# Patient Record
Sex: Male | Born: 1960 | Race: White | Hispanic: No | Marital: Married | Smoking: Former smoker
Health system: Southern US, Community
[De-identification: ages and names within clinical notes are randomized; demographics above are authoritative.]

## PROBLEM LIST (undated history)

## (undated) DIAGNOSIS — E7800 Pure hypercholesterolemia, unspecified: Secondary | ICD-10-CM

## (undated) DIAGNOSIS — C801 Malignant (primary) neoplasm, unspecified: Secondary | ICD-10-CM

## (undated) HISTORY — PX: TOTAL HIP ARTHROPLASTY: SHX124

## (undated) HISTORY — PX: MOHS SURGERY: SHX181

## (undated) HISTORY — PX: COLONOSCOPY: SHX174

---

## 2021-07-13 ENCOUNTER — Ambulatory Visit (HOSPITAL_BASED_OUTPATIENT_CLINIC_OR_DEPARTMENT_OTHER): Payer: BC Managed Care – PPO | Admitting: Orthopaedic Surgery

## 2021-07-13 ENCOUNTER — Ambulatory Visit (INDEPENDENT_AMBULATORY_CARE_PROVIDER_SITE_OTHER): Payer: BC Managed Care – PPO

## 2021-07-13 ENCOUNTER — Other Ambulatory Visit (HOSPITAL_BASED_OUTPATIENT_CLINIC_OR_DEPARTMENT_OTHER): Payer: Self-pay

## 2021-07-13 DIAGNOSIS — S86012A Strain of left Achilles tendon, initial encounter: Secondary | ICD-10-CM

## 2021-07-13 DIAGNOSIS — M25572 Pain in left ankle and joints of left foot: Secondary | ICD-10-CM | POA: Diagnosis not present

## 2021-07-13 MED ORDER — IBUPROFEN 800 MG PO TABS
800.0000 mg | ORAL_TABLET | Freq: Three times a day (TID) | ORAL | 0 refills | Status: AC
Start: 2021-07-13 — End: 2021-07-23
  Filled 2021-07-13: qty 30, 10d supply, fill #0

## 2021-07-13 MED ORDER — ASPIRIN 325 MG PO TBEC
325.0000 mg | DELAYED_RELEASE_TABLET | Freq: Every day | ORAL | 0 refills | Status: DC
  Filled 2021-07-13: qty 30, 30d supply, fill #0

## 2021-07-13 MED ORDER — ACETAMINOPHEN 500 MG PO TABS
500.0000 mg | ORAL_TABLET | Freq: Three times a day (TID) | ORAL | 0 refills | Status: AC
  Filled 2021-07-13: qty 30, 10d supply, fill #0

## 2021-07-13 MED ORDER — OXYCODONE HCL 5 MG PO TABS
5.0000 mg | ORAL_TABLET | ORAL | 0 refills | Status: AC | PRN
  Filled 2021-07-13: qty 20, 4d supply, fill #0

## 2021-07-13 NOTE — Progress Notes (Addendum)
Chief Complaint: Left Achilles rupture     History of Present Illness:    Victor Powers is a 61 y.o. male presents today after he had ruptured his Achilles on the previous Saturday playing basketball.  He states that he enjoys doing this as this past time.  He immediately felt a pop in the heel and subsequently had bruising and difficulty pushing off with the foot.  He is a father of Victor Powers here at [facility].  He is overall extremely active.  He does enjoy skiing and golfing as well as basketball.  He has been taking ibuprofen for pain.  He was placed in a splint in the emergency room and given crutches and told to remain nonweightbearing.  He is here today for further evaluation and assessment.  He is specifically concerned about his long-term outcome as he remains extremely active.  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 He is looking forward to an upcoming international trip this year where he will be walking many miles daily.    Surgical History:   None  PMH/PSH/Family History/Social History/Meds/Allergies:   No past medical history on file.  Social History   Socioeconomic History   Marital status: Married    Spouse name: Not on file   Number of children: Not on file   Years of education: Not on file   Highest education level: Not on file  Occupational History   Not on file  Tobacco Use   Smoking status: Not on file   Smokeless tobacco: Not on file  Substance and Sexual Activity   Alcohol use: Not on file   Drug use: Not on file   Sexual activity: Not on file  Other Topics Concern   Not on file  Social History Narrative   Not on file   Social Determinants of Health   Financial Resource Strain: Not on file  Food Insecurity: Not on file  Transportation Needs: Not on file  Physical Activity: Not on file  Stress: Not on file  Social Connections: Not on file   No  family history on file. No Known Allergies Current Outpatient Medications  Medication Sig Dispense Refill   acetaminophen (TYLENOL) 500 MG tablet Take 1 tablet (500 mg total) by mouth every 8 (eight) hours for 10 days. 30 tablet 0   aspirin EC 325 MG tablet Take 1 tablet (325 mg total) by mouth daily. 30 tablet 0   ibuprofen (ADVIL) 800 MG tablet Take 1 tablet (800 mg total) by mouth every 8 (eight) hours for 10 days. Please take with food, please alternate with acetaminophen 30 tablet 0   oxyCODONE (OXY IR/ROXICODONE) 5 MG immediate release tablet Take 1 tablet (5 mg total) by mouth every 4 (four) hours as needed (severe pain). 20 tablet 0   No current facility-administered medications for this visit.   No results found.  Review of Systems:   A ROS was performed including pertinent positives and negatives as documented in the HPI.  Physical Exam :   Constitutional: NAD and appears stated age Neurological: Alert and oriented Psych: Appropriate affect and cooperative There were no vitals taken for this visit.   Comprehensive Musculoskeletal Exam:    There is a palpable defect of the mid substance of  the left Achilles.  Thompson test reveals no ability to plantarflex when the calf is squeezed in prone position.  There is significant resting equinus of nearly 15 degrees compared to the contralateral side.  There is bruising about the foot and ankle area.  Sensation is intact in all distributions of the left foot.  2+ dorsalis pedis pulse  Imaging:   Xray (left ankle 3 views): Soft tissue defect in the area of the Achilles consistent with an Achilles tendon rupture    I personally reviewed and interpreted the radiographs.   Assessment:   61 y.o. male very active with a mid substance Achilles rupture after playing basketball.  At today's visit I did have a long discussion with him regarding the treatment options.  We specifically discussed conservative treatment in a boot with  functional rehab versus operative intervention.  I described that in terms of operative intervention, the additional benefits would be lack of deficit of liftoff strength as well as a lower risk of rerupture in the future.  He has significant concern about both of these as he does remain quite active in terms of skiing as well as playing basketball and hiking.  Specifically he states that he does use the left leg for pushoff and jump off when he is shooting it with flareups and he is concerned about this.  I discussed the conservative management could lead to a good outcome although some component of pushoff strength would likely be present given his difference in resting equinus on the [place] test.  Given the fact that he does remain quite active he is specifically hoping to undergo Achilles repair.  I did discuss the specific complications associated with Achilles repair including lack of skin healing and infection.  He understands this would like to proceed.  Plan :    -We will plan for left Achilles tendon repair   After a lengthy discussion of treatment options, including risks, benefits, alternatives, complications of surgical and nonsurgical conservative options, the patient elected surgical repair.   The patient  is aware of the material risks  and complications including, but not limited to injury to adjacent structures, neurovascular injury, infection, numbness, bleeding, implant failure, thermal burns, stiffness, persistent pain, failure to heal, disease transmission from allograft, need for further surgery, dislocation, anesthetic risks, blood clots, risks of death,and others. The probabilities of surgical success and failure discussed with patient given their particular co-morbidities.The time and nature of expected rehabilitation and recovery was discussed.The patient's questions were all answered preoperatively.  No barriers to understanding were noted. I explained the natural history of  the disease process and Rx rationale.  I explained to the patient what I considered to be reasonable expectations given their personal situation.  The final treatment plan was arrived at through a shared patient decision making process model.      I personally saw and evaluated the patient, and participated in the management and treatment plan.  Vanetta Mulders, MD Attending Physician, Orthopedic Surgery  This document was dictated using Dragon voice recognition software. A reasonable attempt at proof reading has been made to minimize errors.

## 2021-07-13 NOTE — H&P (View-Only) (Signed)
Chief Complaint: Left Achilles rupture     History of Present Illness:    Victor Powers is a 61 y.o. male presents today after he had ruptured his Achilles on the previous Saturday playing basketball.  He states that he enjoys doing this as this past time.  He immediately felt a pop in the heel and subsequently had bruising and difficulty pushing off with the foot.  He is a father of Victor Powers here at [facility].  He is overall extremely active.  He does enjoy skiing and golfing as well as basketball.  He has been taking ibuprofen for pain.  He was placed in a splint in the emergency room and given crutches and told to remain nonweightbearing.  He is here today for further evaluation and assessment.  He is specifically concerned about his long-term outcome as he remains extremely active.  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 He is looking forward to an upcoming international trip this year where he will be walking many miles daily.    Surgical History:   None  PMH/PSH/Family History/Social History/Meds/Allergies:   No past medical history on file.  Social History   Socioeconomic History   Marital status: Married    Spouse name: Not on file   Number of children: Not on file   Years of education: Not on file   Highest education level: Not on file  Occupational History   Not on file  Tobacco Use   Smoking status: Not on file   Smokeless tobacco: Not on file  Substance and Sexual Activity   Alcohol use: Not on file   Drug use: Not on file   Sexual activity: Not on file  Other Topics Concern   Not on file  Social History Narrative   Not on file   Social Determinants of Health   Financial Resource Strain: Not on file  Food Insecurity: Not on file  Transportation Needs: Not on file  Physical Activity: Not on file  Stress: Not on file  Social Connections: Not on file   No  family history on file. No Known Allergies Current Outpatient Medications  Medication Sig Dispense Refill   acetaminophen (TYLENOL) 500 MG tablet Take 1 tablet (500 mg total) by mouth every 8 (eight) hours for 10 days. 30 tablet 0   aspirin EC 325 MG tablet Take 1 tablet (325 mg total) by mouth daily. 30 tablet 0   ibuprofen (ADVIL) 800 MG tablet Take 1 tablet (800 mg total) by mouth every 8 (eight) hours for 10 days. Please take with food, please alternate with acetaminophen 30 tablet 0   oxyCODONE (OXY IR/ROXICODONE) 5 MG immediate release tablet Take 1 tablet (5 mg total) by mouth every 4 (four) hours as needed (severe pain). 20 tablet 0   No current facility-administered medications for this visit.   No results found.  Review of Systems:   A ROS was performed including pertinent positives and negatives as documented in the HPI.  Physical Exam :   Constitutional: NAD and appears stated age Neurological: Alert and oriented Psych: Appropriate affect and cooperative There were no vitals taken for this visit.   Comprehensive Musculoskeletal Exam:    There is a palpable defect of the mid substance of  the left Achilles.  Thompson test reveals no ability to plantarflex when the calf is squeezed in prone position.  There is significant resting equinus of nearly 15 degrees compared to the contralateral side.  There is bruising about the foot and ankle area.  Sensation is intact in all distributions of the left foot.  2+ dorsalis pedis pulse  Imaging:   Xray (left ankle 3 views): Soft tissue defect in the area of the Achilles consistent with an Achilles tendon rupture    I personally reviewed and interpreted the radiographs.   Assessment:   61 y.o. male very active with a mid substance Achilles rupture after playing basketball.  At today's visit I did have a long discussion with him regarding the treatment options.  We specifically discussed conservative treatment in a boot with  functional rehab versus operative intervention.  I described that in terms of operative intervention, the additional benefits would be lack of deficit of liftoff strength as well as a lower risk of rerupture in the future.  He has significant concern about both of these as he does remain quite active in terms of skiing as well as playing basketball and hiking.  Specifically he states that he does use the left leg for pushoff and jump off when he is shooting it with flareups and he is concerned about this.  I discussed the conservative management could lead to a good outcome although some component of pushoff strength would likely be present given his difference in resting equinus on the [place] test.  Given the fact that he does remain quite active he is specifically hoping to undergo Achilles repair.  I did discuss the specific complications associated with Achilles repair including lack of skin healing and infection.  He understands this would like to proceed.  Plan :    -We will plan for left Achilles tendon repair   After a lengthy discussion of treatment options, including risks, benefits, alternatives, complications of surgical and nonsurgical conservative options, the patient elected surgical repair.   The patient  is aware of the material risks  and complications including, but not limited to injury to adjacent structures, neurovascular injury, infection, numbness, bleeding, implant failure, thermal burns, stiffness, persistent pain, failure to heal, disease transmission from allograft, need for further surgery, dislocation, anesthetic risks, blood clots, risks of death,and others. The probabilities of surgical success and failure discussed with patient given their particular co-morbidities.The time and nature of expected rehabilitation and recovery was discussed.The patient's questions were all answered preoperatively.  No barriers to understanding were noted. I explained the natural history of  the disease process and Rx rationale.  I explained to the patient what I considered to be reasonable expectations given their personal situation.  The final treatment plan was arrived at through a shared patient decision making process model.      I personally saw and evaluated the patient, and participated in the management and treatment plan.  Vanetta Mulders, MD Attending Physician, Orthopedic Surgery  This document was dictated using Dragon voice recognition software. A reasonable attempt at proof reading has been made to minimize errors.

## 2021-07-15 ENCOUNTER — Encounter (HOSPITAL_BASED_OUTPATIENT_CLINIC_OR_DEPARTMENT_OTHER): Payer: Self-pay | Admitting: Orthopaedic Surgery

## 2021-07-16 ENCOUNTER — Encounter (HOSPITAL_COMMUNITY): Payer: Self-pay | Admitting: Orthopaedic Surgery

## 2021-07-16 ENCOUNTER — Ambulatory Visit (HOSPITAL_BASED_OUTPATIENT_CLINIC_OR_DEPARTMENT_OTHER): Payer: Self-pay | Admitting: Orthopaedic Surgery

## 2021-07-16 ENCOUNTER — Other Ambulatory Visit: Payer: Self-pay

## 2021-07-16 DIAGNOSIS — S86012A Strain of left Achilles tendon, initial encounter: Secondary | ICD-10-CM

## 2021-07-16 NOTE — Progress Notes (Signed)
Spoke with pt for pre-op call. Pt denies cardiac history, Diabetes or HTN. Pt is treated for high cholesterol.   Shower instructions given to pt and he voiced understanding.

## 2021-07-17 ENCOUNTER — Ambulatory Visit (HOSPITAL_COMMUNITY): Payer: BC Managed Care – PPO | Admitting: Certified Registered"

## 2021-07-17 ENCOUNTER — Other Ambulatory Visit: Payer: Self-pay

## 2021-07-17 ENCOUNTER — Encounter (HOSPITAL_COMMUNITY): Payer: Self-pay | Admitting: Orthopaedic Surgery

## 2021-07-17 ENCOUNTER — Encounter (HOSPITAL_COMMUNITY)
Admission: RE | Disposition: A | Discharge status: Home / Self Care | Payer: Self-pay | Source: Home / Self Care | Attending: Orthopaedic Surgery

## 2021-07-17 ENCOUNTER — Ambulatory Visit (HOSPITAL_COMMUNITY)
Admission: RE | Admit: 2021-07-17 | Discharge: 2021-07-17 | Disposition: A | Discharge status: Home / Self Care | Payer: BC Managed Care – PPO | Attending: Orthopaedic Surgery | Admitting: Orthopaedic Surgery

## 2021-07-17 ENCOUNTER — Other Ambulatory Visit (HOSPITAL_BASED_OUTPATIENT_CLINIC_OR_DEPARTMENT_OTHER): Payer: Self-pay | Admitting: Orthopaedic Surgery

## 2021-07-17 DIAGNOSIS — Y9367 Activity, basketball: Secondary | ICD-10-CM | POA: Insufficient documentation

## 2021-07-17 DIAGNOSIS — Z87891 Personal history of nicotine dependence: Secondary | ICD-10-CM | POA: Diagnosis not present

## 2021-07-17 DIAGNOSIS — S86012A Strain of left Achilles tendon, initial encounter: Secondary | ICD-10-CM

## 2021-07-17 HISTORY — DX: Malignant (primary) neoplasm, unspecified: C80.1

## 2021-07-17 HISTORY — PX: ACHILLES TENDON SURGERY: SHX542

## 2021-07-17 HISTORY — DX: Pure hypercholesterolemia, unspecified: E78.00

## 2021-07-17 LAB — CBC
HCT: 44.7 % (ref 39.0–52.0)
Hemoglobin: 15.6 g/dL (ref 13.0–17.0)
MCH: 31.6 pg (ref 26.0–34.0)
MCHC: 34.9 g/dL (ref 30.0–36.0)
MCV: 90.5 fL (ref 80.0–100.0)
Platelets: 277 10*3/uL (ref 150–400)
RBC: 4.94 MIL/uL (ref 4.22–5.81)
RDW: 11.8 % (ref 11.5–15.5)
WBC: 5.9 10*3/uL (ref 4.0–10.5)
nRBC: 0 % (ref 0.0–0.2)

## 2021-07-17 LAB — BASIC METABOLIC PANEL
Anion gap: 9 (ref 5–15)
BUN: 17 mg/dL (ref 8–23)
CO2: 26 mmol/L (ref 22–32)
Calcium: 9.6 mg/dL (ref 8.9–10.3)
Chloride: 105 mmol/L (ref 98–111)
Creatinine, Ser: 1.06 mg/dL (ref 0.61–1.24)
GFR, Estimated: >60 mL/min (ref >60)
Glucose, Bld: 112 mg/dL — ABNORMAL HIGH (ref 70–99)
Potassium: 3.9 mmol/L (ref 3.5–5.1)
Sodium: 140 mmol/L (ref 135–145)

## 2021-07-17 SURGERY — REPAIR, TENDON, ACHILLES
Anesthesia: General | Site: Leg Lower | Laterality: Left

## 2021-07-17 MED ORDER — ONDANSETRON HCL 4 MG/2ML IJ SOLN
INTRAMUSCULAR | Status: DC | PRN
  Administered 2021-07-17: 4 mg via INTRAVENOUS

## 2021-07-17 MED ORDER — CHLORHEXIDINE GLUCONATE 0.12 % MT SOLN
15.0000 mL | Freq: Once | OROMUCOSAL | Status: AC
Start: 2021-07-17 — End: 2021-07-17
  Administered 2021-07-17: 15 mL via OROMUCOSAL
  Filled 2021-07-17: qty 15

## 2021-07-17 MED ORDER — TRANEXAMIC ACID-NACL 1000-0.7 MG/100ML-% IV SOLN
INTRAVENOUS | Status: AC
  Filled 2021-07-17: qty 100

## 2021-07-17 MED ORDER — LACTATED RINGERS IV SOLN: INTRAVENOUS | Status: DC

## 2021-07-17 MED ORDER — PROPOFOL 10 MG/ML IV BOLUS
INTRAVENOUS | Status: AC
  Filled 2021-07-17: qty 20

## 2021-07-17 MED ORDER — MIDAZOLAM HCL 2 MG/2ML IJ SOLN
INTRAMUSCULAR | Status: DC | PRN
  Administered 2021-07-17: .5 mg via INTRAVENOUS

## 2021-07-17 MED ORDER — MIDAZOLAM HCL 2 MG/2ML IJ SOLN
INTRAMUSCULAR | Status: AC
  Administered 2021-07-17: 2 mg
  Filled 2021-07-17: qty 2

## 2021-07-17 MED ORDER — TRANEXAMIC ACID-NACL 1000-0.7 MG/100ML-% IV SOLN
1000.0000 mg | INTRAVENOUS | Status: AC
  Administered 2021-07-17: 1000 mg via INTRAVENOUS

## 2021-07-17 MED ORDER — ROCURONIUM BROMIDE 10 MG/ML (PF) SYRINGE
PREFILLED_SYRINGE | INTRAVENOUS | Status: AC
  Filled 2021-07-17: qty 10

## 2021-07-17 MED ORDER — PROPOFOL 10 MG/ML IV BOLUS
INTRAVENOUS | Status: DC | PRN
  Administered 2021-07-17: 160 mg via INTRAVENOUS

## 2021-07-17 MED ORDER — ORAL CARE MOUTH RINSE
15.0000 mL | Freq: Once | OROMUCOSAL | Status: AC
Start: 2021-07-17 — End: 2021-07-17

## 2021-07-17 MED ORDER — ACETAMINOPHEN 500 MG PO TABS
ORAL_TABLET | ORAL | Status: AC
  Filled 2021-07-17: qty 2

## 2021-07-17 MED ORDER — FENTANYL CITRATE (PF) 250 MCG/5ML IJ SOLN
INTRAMUSCULAR | Status: DC | PRN
  Administered 2021-07-17: 50 ug via INTRAVENOUS
  Administered 2021-07-17: 100 ug via INTRAVENOUS

## 2021-07-17 MED ORDER — MIDAZOLAM HCL 2 MG/2ML IJ SOLN
INTRAMUSCULAR | Status: AC
  Filled 2021-07-17: qty 2

## 2021-07-17 MED ORDER — MIDAZOLAM HCL 2 MG/2ML IJ SOLN: 2.0000 mg | Freq: Once | INTRAMUSCULAR | Status: DC

## 2021-07-17 MED ORDER — FENTANYL CITRATE (PF) 100 MCG/2ML IJ SOLN
INTRAMUSCULAR | Status: AC
  Administered 2021-07-17: 50 ug
  Filled 2021-07-17: qty 2

## 2021-07-17 MED ORDER — LIDOCAINE 2% (20 MG/ML) 5 ML SYRINGE
INTRAMUSCULAR | Status: AC
  Filled 2021-07-17: qty 5

## 2021-07-17 MED ORDER — 0.9 % SODIUM CHLORIDE (POUR BTL) OPTIME
TOPICAL | Status: DC | PRN
  Administered 2021-07-17: 1000 mL

## 2021-07-17 MED ORDER — LIDOCAINE 2% (20 MG/ML) 5 ML SYRINGE
INTRAMUSCULAR | Status: DC | PRN
  Administered 2021-07-17: 60 mg via INTRAVENOUS

## 2021-07-17 MED ORDER — DEXAMETHASONE SODIUM PHOSPHATE 10 MG/ML IJ SOLN
INTRAMUSCULAR | Status: AC
  Filled 2021-07-17: qty 1

## 2021-07-17 MED ORDER — ROCURONIUM BROMIDE 10 MG/ML (PF) SYRINGE
PREFILLED_SYRINGE | INTRAVENOUS | Status: DC | PRN
  Administered 2021-07-17: 50 mg via INTRAVENOUS

## 2021-07-17 MED ORDER — FENTANYL CITRATE (PF) 100 MCG/2ML IJ SOLN: 50.0000 ug | Freq: Once | INTRAMUSCULAR | Status: DC

## 2021-07-17 MED ORDER — SUGAMMADEX SODIUM 200 MG/2ML IV SOLN
INTRAVENOUS | Status: DC | PRN
  Administered 2021-07-17: 200 mg via INTRAVENOUS

## 2021-07-17 MED ORDER — GABAPENTIN 300 MG PO CAPS
300.0000 mg | ORAL_CAPSULE | Freq: Once | ORAL | Status: AC
  Administered 2021-07-17: 300 mg via ORAL

## 2021-07-17 MED ORDER — DEXAMETHASONE SODIUM PHOSPHATE 10 MG/ML IJ SOLN
INTRAMUSCULAR | Status: DC | PRN
  Administered 2021-07-17: 4 mg via INTRAVENOUS

## 2021-07-17 MED ORDER — CEFAZOLIN SODIUM-DEXTROSE 2-4 GM/100ML-% IV SOLN
INTRAVENOUS | Status: AC
  Filled 2021-07-17: qty 100

## 2021-07-17 MED ORDER — CEFAZOLIN SODIUM-DEXTROSE 2-4 GM/100ML-% IV SOLN
2.0000 g | INTRAVENOUS | Status: AC
  Administered 2021-07-17: 2 g via INTRAVENOUS

## 2021-07-17 MED ORDER — ONDANSETRON HCL 4 MG/2ML IJ SOLN
INTRAMUSCULAR | Status: AC
  Filled 2021-07-17: qty 2

## 2021-07-17 MED ORDER — BUPIVACAINE-EPINEPHRINE (PF) 0.5% -1:200000 IJ SOLN
INTRAMUSCULAR | Status: DC | PRN
  Administered 2021-07-17: 30 mL via PERINEURAL

## 2021-07-17 MED ORDER — ACETAMINOPHEN 500 MG PO TABS: 1000.0000 mg | ORAL_TABLET | Freq: Once | ORAL | Status: DC

## 2021-07-17 MED ORDER — FENTANYL CITRATE (PF) 250 MCG/5ML IJ SOLN
INTRAMUSCULAR | Status: AC
  Filled 2021-07-17: qty 5

## 2021-07-17 MED ORDER — GABAPENTIN 300 MG PO CAPS
ORAL_CAPSULE | ORAL | Status: AC
  Filled 2021-07-17: qty 1

## 2021-07-17 MED ORDER — PHENYLEPHRINE HCL-NACL 20-0.9 MG/250ML-% IV SOLN
INTRAVENOUS | Status: DC | PRN
  Administered 2021-07-17: 50 ug/min via INTRAVENOUS
  Administered 2021-07-17: 35 ug/min via INTRAVENOUS

## 2021-07-17 SURGICAL SUPPLY — 40 items
BAG COUNTER SPONGE SURGICOUNT (BAG) ×2 IMPLANT
BNDG COHESIVE 6X5 TAN STRL LF (GAUZE/BANDAGES/DRESSINGS) ×3 IMPLANT
BNDG ELASTIC 6X5.8 VLCR STR LF (GAUZE/BANDAGES/DRESSINGS) ×1 IMPLANT
CANISTER SUCT 3000ML PPV (MISCELLANEOUS) ×2 IMPLANT
COVER SURGICAL LIGHT HANDLE (MISCELLANEOUS) ×3 IMPLANT
DRAPE U-SHAPE 47X51 STRL (DRAPES) ×2 IMPLANT
DRSG ADAPTIC 3X8 NADH LF (GAUZE/BANDAGES/DRESSINGS) ×2 IMPLANT
DRSG XEROFORM 1X8 (GAUZE/BANDAGES/DRESSINGS) ×1 IMPLANT
DURAPREP 26ML APPLICATOR (WOUND CARE) ×2 IMPLANT
ELECT REM PT RETURN 9FT ADLT (ELECTROSURGICAL) ×2
ELECTRODE REM PT RTRN 9FT ADLT (ELECTROSURGICAL) ×1 IMPLANT
GAUZE SPONGE 4X4 12PLY STRL (GAUZE/BANDAGES/DRESSINGS) ×2 IMPLANT
GLOVE BIOGEL PI IND STRL 9 (GLOVE) ×1 IMPLANT
GLOVE BIOGEL PI INDICATOR 9 (GLOVE) ×1
GLOVE SRG 8 PF TXTR STRL LF DI (GLOVE) ×1 IMPLANT
GLOVE SURG ENC MOIS LTX SZ6 (GLOVE) ×2 IMPLANT
GLOVE SURG ORTHO 9.0 STRL STRW (GLOVE) ×2 IMPLANT
GLOVE SURG SYN 7.5  E (GLOVE) ×3
GLOVE SURG SYN 7.5 E (GLOVE) ×3 IMPLANT
GLOVE SURG SYN 7.5 PF PI (GLOVE) ×3 IMPLANT
GLOVE SURG UNDER POLY LF SZ6.5 (GLOVE) ×2 IMPLANT
GLOVE SURG UNDER POLY LF SZ8 (GLOVE) ×1
GOWN STRL REUS W/ TWL XL LVL3 (GOWN DISPOSABLE) ×3 IMPLANT
GOWN STRL REUS W/TWL XL LVL3 (GOWN DISPOSABLE) ×3
KIT PARS SUTURE TAPE IMPL (Miscellaneous) ×1 IMPLANT
KIT TURNOVER KIT B (KITS) ×2 IMPLANT
NDL SUT .5 MAYO 1.404X.05X (NEEDLE) ×1 IMPLANT
NEEDLE MAYO TAPER (NEEDLE) ×1
NS IRRIG 1000ML POUR BTL (IV SOLUTION) ×2 IMPLANT
PACK ORTHO EXTREMITY (CUSTOM PROCEDURE TRAY) ×2 IMPLANT
PAD ARMBOARD 7.5X6 YLW CONV (MISCELLANEOUS) ×4 IMPLANT
PADDING CAST ABS 6INX4YD NS (CAST SUPPLIES) ×1
PADDING CAST ABS COTTON 6X4 NS (CAST SUPPLIES) IMPLANT
SPONGE T-LAP 18X18 ~~LOC~~+RFID (SPONGE) ×2 IMPLANT
SUT ETHILON 3 0 PS 1 (SUTURE) ×1 IMPLANT
TOWEL GREEN STERILE (TOWEL DISPOSABLE) ×2 IMPLANT
TOWEL GREEN STERILE FF (TOWEL DISPOSABLE) ×2 IMPLANT
TUBE CONNECTING 12X1/4 (SUCTIONS) ×2 IMPLANT
WATER STERILE IRR 1000ML POUR (IV SOLUTION) ×2 IMPLANT
YANKAUER SUCT BULB TIP NO VENT (SUCTIONS) ×2 IMPLANT

## 2021-07-17 NOTE — Anesthesia Procedure Notes (Signed)
Anesthesia Regional Block: Popliteal block   Pre-Anesthetic Checklist: , timeout performed,  Correct Patient, Correct Site, Correct Laterality,  Correct Procedure, Correct Position, site marked,  Risks and benefits discussed,  Surgical consent,  Pre-op evaluation,  At surgeon's request and post-op pain management  Laterality: Left  Prep: chloraprep       Needles:  Injection technique: Single-shot  Needle Type: Echogenic Stimulator Needle     Needle Length: 9cm  Needle Gauge: 21     Additional Needles:   Procedures:,,,, ultrasound used (permanent image in chart),,    Narrative:  Start time: 07/17/2021 10:00 AM End time: 07/17/2021 10:05 AM Injection made incrementally with aspirations every 5 mL.  Performed by: Personally  Anesthesiologist: Effie Berkshire, MD  Additional Notes: Patient tolerated the procedure well. Local anesthetic introduced in an incremental fashion under minimal resistance after negative aspirations. No paresthesias were elicited. After completion of the procedure, no acute issues were identified and patient continued to be monitored by RN.

## 2021-07-17 NOTE — Anesthesia Preprocedure Evaluation (Addendum)
Anesthesia Evaluation  Patient identified by MRN, date of birth, ID band Patient awake    Reviewed: Allergy & Precautions, NPO status , Patient's Chart, lab work & pertinent test results  Airway Mallampati: I  TM Distance: >3 FB Neck ROM: Full    Dental  (+) Teeth Intact, Dental Advisory Given   Pulmonary former smoker,    breath sounds clear to auscultation       Cardiovascular negative cardio ROS   Rhythm:Regular Rate:Normal     Neuro/Psych negative neurological ROS  negative psych ROS   GI/Hepatic negative GI ROS, Neg liver ROS,   Endo/Other  negative endocrine ROS  Renal/GU negative Renal ROS     Musculoskeletal negative musculoskeletal ROS (+)   Abdominal Normal abdominal exam  (+)   Peds  Hematology negative hematology ROS (+)   Anesthesia Other Findings   Reproductive/Obstetrics                            Anesthesia Physical Anesthesia Plan  ASA: 2  Anesthesia Plan: General   Post-op Pain Management: Regional block*   Induction: Intravenous  PONV Risk Score and Plan: 3 and Ondansetron, Dexamethasone and Midazolam  Airway Management Planned: Oral ETT  Additional Equipment: None  Intra-op Plan:   Post-operative Plan: Extubation in OR  Informed Consent: I have reviewed the patients History and Physical, chart, labs and discussed the procedure including the risks, benefits and alternatives for the proposed anesthesia with the patient or authorized representative who has indicated his/her understanding and acceptance.     Dental advisory given  Plan Discussed with: CRNA  Anesthesia Plan Comments:        Anesthesia Quick Evaluation

## 2021-07-17 NOTE — Interval H&P Note (Signed)
History and Physical Interval Note:  07/17/2021 10:24 AM  Victor Powers  has presented today for surgery, with the diagnosis of [place].  The various methods of treatment have been discussed with the patient and family. After consideration of risks, benefits and other options for treatment, the patient has consented to  Procedure(s): LEFT ACHILLES TENDON REPAIR (Left) as a surgical intervention.  The patient's history has been reviewed, patient examined, no change in status, stable for surgery.  I have reviewed the patient's chart and labs.  Questions were answered to the patient's satisfaction.     Vanetta Mulders

## 2021-07-17 NOTE — Discharge Instructions (Signed)
     Discharge Instructions    Attending Surgeon: Vanetta Mulders, MD Office Phone Number: [phone]   Diagnosis and Procedures:    Surgeries Performed: Left achilles tendon repair  Discharge Plan:    Diet: Resume usual diet. Begin with light or bland foods.  Drink plenty of fluids.  Activity:  Non weightbearing in your boot with crutches. You are advised to go home directly from the hospital or surgical center. Restrict your activities.  GENERAL INSTRUCTIONS: 1.  Please apply ice to your wound to help with swelling and inflammation. This will improve your comfort and your overall recovery following surgery.     2. Please call Dr. Eddie Dibbles office at [phone] with questions Monday-Friday during business hours. If no one answers, please leave a message and someone should get back to the patient within 24 hours. For emergencies please call 911 or proceed to the emergency room.   3. Patient to notify surgical team if experiences any of the following: Bowel/Bladder dysfunction, uncontrolled pain, nerve/muscle weakness, incision with increased drainage or redness, nausea/vomiting and Fever greater than 101.0 F.  Be alert for signs of infection including redness, streaking, odor, fever or chills. Be alert for excessive pain or bleeding and notify your surgeon immediately.  WOUND INSTRUCTIONS:   Leave your dressing, cast, or splint in place until your post operative visit.  Keep it clean and dry.  Always keep the incision clean and dry until the staples/sutures are removed. If there is no drainage from the incision you should keep it open to air. If there is drainage from the incision you must keep it covered at all times until the drainage stops  Do not soak in a bath tub, hot tub, pool, lake or other body of water until 21 days after your surgery and your incision is completely dry and healed.  If you have removable sutures (or staples) they must be removed 10-14 days (unless  otherwise instructed) from the day of your surgery.     1)  Elevate the extremity as much as possible.  2)  Keep the dressing clean and dry.  3)  Please call us if the dressing becomes wet or dirty.  4)  If you are experiencing worsening pain or worsening swelling, please call.     MEDICATIONS: Resume all previous home medications at the previous prescribed dose and frequency unless otherwise noted Start taking the  pain medications on an as-needed basis as prescribed  Please taper down pain medication over the next week following surgery.  Ideally you should not require a refill of any narcotic pain medication.  Take pain medication with food to minimize nausea. In addition to the prescribed pain medication, you may take over-the-counter pain relievers such as Tylenol.  Do NOT take additional tylenol if your pain medication already has tylenol in it.  Aspirin '325mg'$  daily for four weeks.      FOLLOWUP INSTRUCTIONS: 1. Follow up at the Physical Therapy Clinic 3-4 days following surgery. This appointment should be scheduled unless other arrangements have been made.The Physical Therapy scheduling number is [phone] if an appointment has not already been arranged.  2. Contact Dr. Eddie Dibbles office during office hours at [phone] or the practice after hours line at [phone] for non-emergencies. For medical emergencies call 911.   Discharge Location: Home

## 2021-07-17 NOTE — Brief Op Note (Signed)
   Brief Op Note  Date of Surgery: 07/17/2021  Preoperative Diagnosis: LEFT ACHILLES TENDON RUPURE  Postoperative Diagnosis: same  Procedure: Procedure(s): LEFT ACHILLES TENDON REPAIR  Implants: Implant Name Type Inv. Item Serial No. Manufacturer Lot No. LRB No. Used Action  KIT PARS SUTURE TAPE IMPL - [id number] Miscellaneous KIT PARS SUTURE TAPE IMPL  [employer] [id number] Left 1 Implanted    Surgeons: Surgeon(s): Vanetta Mulders, MD  Anesthesia: General    Estimated Blood Loss: See anesthesia record  Complications: None  Condition to PACU: Stable  Yevonne Pax, MD 07/17/2021 11:41 AM

## 2021-07-17 NOTE — Transfer of Care (Signed)
Immediate Anesthesia Transfer of Care Note  Patient: Victor Powers  Procedure(s) Performed: LEFT ACHILLES TENDON REPAIR (Left: Leg Lower)  Patient Location: PACU  Anesthesia Type:General and Regional  Level of Consciousness: awake, drowsy, patient cooperative and responds to stimulation  Airway & Oxygen Therapy: Patient Spontanous Breathing and Patient connected to nasal cannula oxygen  Post-op Assessment: Report given to RN and Post -op Vital signs reviewed and stable  Post vital signs: Reviewed and stable  Last Vitals:  Vitals Value Taken Time  BP 131/77 07/17/21 1147  Temp    Pulse 63 07/17/21 1148  Resp 15 07/17/21 1148  SpO2 100 % 07/17/21 1148  Vitals shown include unvalidated device data.  Last Pain:  Vitals:   07/17/21 0908  TempSrc:   PainSc: 0-No pain         Complications: No notable events documented.

## 2021-07-17 NOTE — Op Note (Signed)
Date of Surgery: 07/17/2021  INDICATIONS: Victor Powers is a 61 y.o.-year-old male with a left Achilles rupture after playing basketball.  The risks and benefits of operative management were discussed with him and he elected for operative repair.  The risk and benefits of the procedure were discussed in detail and documented in the pre-operative evaluation.   PREOPERATIVE DIAGNOSIS: 1.  Left mid substance Achilles tendon rupture  POSTOPERATIVE DIAGNOSIS: Same.  PROCEDURE: 1.  Left Achilles tendon repair  SURGEON: Yevonne Pax MD  ASSISTANT: Raynelle Fanning, ATC  ANESTHESIA:  general plus regional nerve block  IV FLUIDS AND URINE: See anesthesia record.  ANTIBIOTICS: Ancef  ESTIMATED BLOOD LOSS: 10 mL.  IMPLANTS:  Implant Name Type Inv. Item Serial No. Manufacturer Lot No. LRB No. Used Action  KIT PARS SUTURE TAPE IMPL - [id number] Miscellaneous KIT PARS SUTURE TAPE IMPL  [employer] [id number] Left 1 Implanted    DRAINS: None  CULTURES: None  COMPLICATIONS: none  DESCRIPTION OF PROCEDURE:   I identified the patient in the pre-operative holding area.  I marked the operative left ankle with my initials. I reviewed the risks and benefits of the proposed surgical intervention and the patient wished to proceed.  Anesthesia was then performed with regional block.  The patient was transferred to the operative suite and placed in the prone position with all bony prominences padded.     SCDs were placed on the non-operative lower extremity. Appropriate antibiotics was administered within 1 hour before incision. The operative extremity was then prepped and draped in standard fashion. A time out was performed confirming the correct extremity, correct patient and correct procedure.  A 2 cm posteromedial horizontal incision center over the papable defect was made. Full thickness skin flaps were made. The paratenon was cut in a longitudinal fashion and flaps created. The proximal end of the  tendon was retrieved with an alise clamp. The mopped ends were debrided. A cobb was used to bluntly release adhesions proximally and good mobility was created of the proximal tendon. The Arth[employer] PARs device was placed through the paratenon layer up the proximal tendon. Sutures were placed through the jig and retrieved out the other end. The sutures were then collected as the device was pulled out and through the paratenon layer. Passing sutures were pulled to create a locking stitch. These sutures were protected.  The steps were repeated for the distal aspect of the tendon. An Alice clamp secured the tendon and blunt dissection was made to free up adhesions. The PARs device was placed distally through the paratenon layer. The sutures were passed through the jig and retrieved. The PARs device was pulled out and sutures were retrieved out through the paratenon. Passing sutures were pulled to create a locking stitch.  Next, the foot was held in maximum plantarflexion. suture from the proximal and distal tendon stumps were tied together sequentially. Appropriate tension with approximation of the tissues was had. The wound was copiously irrigated. The paratenon was closed with 0-vicryl. The skin was closed in layers with 2-0 vicryl and 3-0 nylon. The wound was dressed with steri-strips, xeroform, 4x8s, webril, and CAM boot with a heel lift  The patient awoke from anesthesia without difficulty and was transferred to the PACU in stable condition.    POSTOPERATIVE PLAN: The patient will be nonweightbearing on his left lower extremity.  He will be in a cam boot with a heel lift.  I will see him back in 2 weeks for suture removal.  He will be given crutches.  He will begin physical therapy postop.  Yevonne Pax, MD 11:43 AM

## 2021-07-17 NOTE — Anesthesia Postprocedure Evaluation (Signed)
Anesthesia Post Note  Patient: Clinton Dragone  Procedure(s) Performed: LEFT ACHILLES TENDON REPAIR (Left: Leg Lower)     Patient location during evaluation: PACU Anesthesia Type: General Level of consciousness: awake and alert Pain management: pain level controlled Vital Signs Assessment: post-procedure vital signs reviewed and stable Respiratory status: spontaneous breathing, nonlabored ventilation, respiratory function stable and patient connected to nasal cannula oxygen Cardiovascular status: blood pressure returned to baseline and stable Postop Assessment: no apparent nausea or vomiting Anesthetic complications: no   No notable events documented.  Last Vitals:  Vitals:   07/17/21 1145 07/17/21 1202  BP: 131/77 138/71  Pulse: 65 (!) 58  Resp: 19 15  Temp: 36.7 C   SpO2: 98% 99%    Last Pain:  Vitals:   07/17/21 1202  TempSrc:   PainSc: 0-No pain                 Effie Berkshire

## 2021-07-17 NOTE — Anesthesia Procedure Notes (Signed)
Procedure Name: Intubation Date/Time: 07/17/2021 10:39 AM Performed by: Cathren Harsh, CRNA Pre-anesthesia Checklist: Patient identified, Emergency Drugs available, Suction available and Patient being monitored Patient Re-evaluated:Patient Re-evaluated prior to induction Oxygen Delivery Method: Circle System Utilized Preoxygenation: Pre-oxygenation with 100% oxygen Induction Type: IV induction Ventilation: Mask ventilation without difficulty Laryngoscope Size: Mac and 3 Grade View: Grade II Tube type: Oral Tube size: 7.5 mm Number of attempts: 1 Airway Equipment and Method: Stylet and Oral airway Placement Confirmation: ETT inserted through vocal cords under direct vision, positive ETCO2 and breath sounds checked- equal and bilateral Secured at: 24 cm Tube secured with: Tape Dental Injury: Teeth and Oropharynx as per pre-operative assessment

## 2021-07-23 ENCOUNTER — Encounter (HOSPITAL_COMMUNITY): Payer: Self-pay | Admitting: Orthopaedic Surgery

## 2021-07-30 ENCOUNTER — Ambulatory Visit (INDEPENDENT_AMBULATORY_CARE_PROVIDER_SITE_OTHER): Payer: BC Managed Care – PPO | Admitting: Orthopaedic Surgery

## 2021-07-30 DIAGNOSIS — S86012A Strain of left Achilles tendon, initial encounter: Secondary | ICD-10-CM

## 2021-07-30 NOTE — Progress Notes (Signed)
Post Operative Evaluation    Procedure/Date of Surgery: Left Achilles tendon repair 07/17/21  Interval History:    Presents today for 2-week follow-up status post left Achilles tendon repair.  Overall he is doing very well.  Denies any redness or erythema about the incision.  Denies any fevers.  He has been compliant with aspirin usage.  Has been nonweightbearing in his boot with 4 heel lifts.  Here today for further assessment   PMH/PSH/Family History/Social History/Meds/Allergies:    Past Medical History:  Diagnosis Date   Cancer ([place])    basal and squamous - face and arms   High cholesterol    Past Surgical History:  Procedure Laterality Date   ACHILLES TENDON SURGERY Left 07/17/2021   Procedure: LEFT ACHILLES TENDON REPAIR;  Surgeon: Vanetta Mulders, MD;  Location: [facility];  Service: Orthopedics;  Laterality: Left;  with regional block   COLONOSCOPY     MOHS SURGERY     x 2   TOTAL HIP ARTHROPLASTY Right    Social History   Socioeconomic History   Marital status: Married    Spouse name: Not on file   Number of children: Not on file   Years of education: Not on file   Highest education level: Not on file  Occupational History   Not on file  Tobacco Use   Smoking status: Former    Types: Cigarettes   Smokeless tobacco: Never  Vaping Use   Vaping Use: Never used  Substance and Sexual Activity   Alcohol use: Yes    Comment: occasional beer or scotch   Drug use: Never   Sexual activity: Not on file  Other Topics Concern   Not on file  Social History Narrative   Not on file   Social Determinants of Health   Financial Resource Strain: Not on file  Food Insecurity: Not on file  Transportation Needs: Not on file  Physical Activity: Not on file  Stress: Not on file  Social Connections: Not on file   No family history on file. No Known Allergies Current Outpatient Medications  Medication Sig Dispense Refill   aspirin EC 325 MG  tablet Take 1 tablet (325 mg total) by mouth daily. 30 tablet 0   aspirin EC 81 MG tablet Take 81 mg by mouth daily. Swallow whole.     atorvastatin (LIPITOR) 10 MG tablet Take 10 mg by mouth daily.     ibuprofen (ADVIL) 200 MG tablet Take 400-600 mg by mouth every 6 (six) hours as needed for moderate pain.     Multiple Vitamin (MULTIVITAMIN WITH MINERALS) TABS tablet Take 1 tablet by mouth daily.     oxyCODONE (OXY IR/ROXICODONE) 5 MG immediate release tablet Take 1 tablet (5 mg total) by mouth every 4 (four) hours as needed (severe pain). 20 tablet 0   VITAMIN D PO Take 1 capsule by mouth daily.     No current facility-administered medications for this visit.   No results found.  Review of Systems:   A ROS was performed including pertinent positives and negatives as documented in the HPI.   Musculoskeletal Exam:    There were no vitals taken for this visit.  Left posterior heel incision is well-appearing.  No erythema or drainage.  Sutures removed today.  Good resting equinus position of the left  foot.  His Thompson test intact.  Sensation is intact throughout all distributions of the left foot including sural nerve.  2+ dorsalis pedis pulse. Imaging:    None  I personally reviewed and interpreted the radiographs.   Assessment:   2 weeks status post left Achilles tendon repair overall doing very well.  At today's visit I have counseled that I would like him to begin weightbearing in his boot.  2 of his heel lifts were removed.  I advised that I would like him to progress over the course of the next 2 weeks.  At that time we will plan to remove his boot.  He was counseled on specific restrictions with regard to the Achilles protocol.  He will continue to advance per protocol.  Plan :    -Return to clinic 4 weeks for reassessment      I personally saw and evaluated the patient, and participated in the management and treatment plan.  Vanetta Mulders, MD Attending Physician,  Orthopedic Surgery  This document was dictated using Dragon voice recognition software. A reasonable attempt at proof reading has been made to minimize errors.

## 2021-08-29 ENCOUNTER — Ambulatory Visit (INDEPENDENT_AMBULATORY_CARE_PROVIDER_SITE_OTHER): Payer: BC Managed Care – PPO | Admitting: Orthopaedic Surgery

## 2021-08-29 DIAGNOSIS — S86012A Strain of left Achilles tendon, initial encounter: Secondary | ICD-10-CM

## 2021-08-29 NOTE — Progress Notes (Signed)
Post Operative Evaluation    Procedure/Date of Surgery: Left Achilles tendon repair 07/17/21  Interval History:    Presents today for for 6-week follow-up of the left Achilles.  Overall he is doing extremely well.  He has been working on range of motion.  He is out of his boot completely.  He is into a normal shoe.  He states overall the swelling is much better.  He experiences no pain at rest.  He has been able to walk up to 4 miles.   PMH/PSH/Family History/Social History/Meds/Allergies:    Past Medical History:  Diagnosis Date   Cancer ([place])    basal and squamous - face and arms   High cholesterol    Past Surgical History:  Procedure Laterality Date   ACHILLES TENDON SURGERY Left 07/17/2021   Procedure: LEFT ACHILLES TENDON REPAIR;  Surgeon: Vanetta Mulders, MD;  Location: [facility];  Service: Orthopedics;  Laterality: Left;  with regional block   COLONOSCOPY     MOHS SURGERY     x 2   TOTAL HIP ARTHROPLASTY Right    Social History   Socioeconomic History   Marital status: Married    Spouse name: Not on file   Number of children: Not on file   Years of education: Not on file   Highest education level: Not on file  Occupational History   Not on file  Tobacco Use   Smoking status: Former    Types: Cigarettes   Smokeless tobacco: Never  Vaping Use   Vaping Use: Never used  Substance and Sexual Activity   Alcohol use: Yes    Comment: occasional beer or scotch   Drug use: Never   Sexual activity: Not on file  Other Topics Concern   Not on file  Social History Narrative   Not on file   Social Determinants of Health   Financial Resource Strain: Not on file  Food Insecurity: Not on file  Transportation Needs: Not on file  Physical Activity: Not on file  Stress: Not on file  Social Connections: Not on file   No family history on file. No Known Allergies Current Outpatient Medications  Medication Sig Dispense Refill   aspirin EC  325 MG tablet Take 1 tablet (325 mg total) by mouth daily. 30 tablet 0   aspirin EC 81 MG tablet Take 81 mg by mouth daily. Swallow whole.     atorvastatin (LIPITOR) 10 MG tablet Take 10 mg by mouth daily.     ibuprofen (ADVIL) 200 MG tablet Take 400-600 mg by mouth every 6 (six) hours as needed for moderate pain.     Multiple Vitamin (MULTIVITAMIN WITH MINERALS) TABS tablet Take 1 tablet by mouth daily.     oxyCODONE (OXY IR/ROXICODONE) 5 MG immediate release tablet Take 1 tablet (5 mg total) by mouth every 4 (four) hours as needed (severe pain). 20 tablet 0   VITAMIN D PO Take 1 capsule by mouth daily.     No current facility-administered medications for this visit.   No results found.  Review of Systems:   A ROS was performed including pertinent positives and negatives as documented in the HPI.   Musculoskeletal Exam:    There were no vitals taken for this visit.  Left posterior heel incision is well-healed.  Dorsiflexion is to 20  degrees compared to 25 on the contralateral side.  Plantarflexion is to 30 degrees and is strong.  There is some gastroc atrophy compared to the contralateral side.  Sensation is intact in all distributions Imaging:    None  I personally reviewed and interpreted the radiographs.   Assessment:   6 weeks status post left Achilles tendon repair overall doing very well.  I would like him to continue to remain in normal shoe at this time.  This time he will begin the strengthening portion of the protocol.  All questions and restrictions were discussed.  Plan :    -Return to clinic 4 weeks for reassessment      I personally saw and evaluated the patient, and participated in the management and treatment plan.  Vanetta Mulders, MD Attending Physician, Orthopedic Surgery  This document was dictated using Dragon voice recognition software. A reasonable attempt at proof reading has been made to minimize errors.

## 2021-09-09 ENCOUNTER — Encounter (HOSPITAL_BASED_OUTPATIENT_CLINIC_OR_DEPARTMENT_OTHER): Payer: Self-pay | Admitting: Orthopaedic Surgery

## 2021-09-10 ENCOUNTER — Ambulatory Visit (INDEPENDENT_AMBULATORY_CARE_PROVIDER_SITE_OTHER): Payer: BC Managed Care – PPO | Admitting: Orthopaedic Surgery

## 2021-09-10 ENCOUNTER — Ambulatory Visit (HOSPITAL_BASED_OUTPATIENT_CLINIC_OR_DEPARTMENT_OTHER): Payer: Self-pay | Admitting: Orthopaedic Surgery

## 2021-09-10 DIAGNOSIS — S86012A Strain of left Achilles tendon, initial encounter: Secondary | ICD-10-CM

## 2021-09-10 MED ORDER — ASPIRIN 325 MG PO TBEC: 325.0000 mg | DELAYED_RELEASE_TABLET | Freq: Every day | ORAL | 0 refills | Status: AC

## 2021-09-10 MED ORDER — IBUPROFEN 800 MG PO TABS: 800.0000 mg | ORAL_TABLET | Freq: Three times a day (TID) | ORAL | 0 refills | Status: AC

## 2021-09-10 MED ORDER — ACETAMINOPHEN 500 MG PO TABS: 500.0000 mg | ORAL_TABLET | Freq: Three times a day (TID) | ORAL | 0 refills | Status: AC

## 2021-09-10 NOTE — Addendum Note (Signed)
Addended by: Yevonne Pax on: 09/10/2021 11:50 AM   Modules accepted: Orders

## 2021-09-10 NOTE — H&P (View-Only) (Signed)
Post Operative Evaluation    Procedure/Date of Surgery: Left Achilles tendon repair 07/17/21  Interval History:    Presents today for follow-up 7 weeks status post left Achilles tendon repair.  Unfortunately he states that he was shooting a basketball at his son's wedding this most recent weekend and felt a pop.  He was wearing normal shoewear at that time.  He experienced significant swelling following that and is here today for further assessment.   PMH/PSH/Family History/Social History/Meds/Allergies:    Past Medical History:  Diagnosis Date   Cancer ([place])    basal and squamous - face and arms   High cholesterol    Past Surgical History:  Procedure Laterality Date   ACHILLES TENDON SURGERY Left 07/17/2021   Procedure: LEFT ACHILLES TENDON REPAIR;  Surgeon: Vanetta Mulders, MD;  Location: [facility];  Service: Orthopedics;  Laterality: Left;  with regional block   COLONOSCOPY     MOHS SURGERY     x 2   TOTAL HIP ARTHROPLASTY Right    Social History   Socioeconomic History   Marital status: Married    Spouse name: Not on file   Number of children: Not on file   Years of education: Not on file   Highest education level: Not on file  Occupational History   Not on file  Tobacco Use   Smoking status: Former    Types: Cigarettes   Smokeless tobacco: Never  Vaping Use   Vaping Use: Never used  Substance and Sexual Activity   Alcohol use: Yes    Comment: occasional beer or scotch   Drug use: Never   Sexual activity: Not on file  Other Topics Concern   Not on file  Social History Narrative   Not on file   Social Determinants of Health   Financial Resource Strain: Not on file  Food Insecurity: Not on file  Transportation Needs: Not on file  Physical Activity: Not on file  Stress: Not on file  Social Connections: Not on file   No family history on file. No Known Allergies Current Outpatient Medications  Medication Sig Dispense  Refill   aspirin EC 325 MG tablet Take 1 tablet (325 mg total) by mouth daily. 30 tablet 0   aspirin EC 81 MG tablet Take 81 mg by mouth daily. Swallow whole.     atorvastatin (LIPITOR) 10 MG tablet Take 10 mg by mouth daily.     ibuprofen (ADVIL) 200 MG tablet Take 400-600 mg by mouth every 6 (six) hours as needed for moderate pain.     Multiple Vitamin (MULTIVITAMIN WITH MINERALS) TABS tablet Take 1 tablet by mouth daily.     oxyCODONE (OXY IR/ROXICODONE) 5 MG immediate release tablet Take 1 tablet (5 mg total) by mouth every 4 (four) hours as needed (severe pain). 20 tablet 0   VITAMIN D PO Take 1 capsule by mouth daily.     No current facility-administered medications for this visit.   No results found.  Review of Systems:   A ROS was performed including pertinent positives and negatives as documented in the HPI.   Musculoskeletal Exam:    There were no vitals taken for this visit.  Left posterior heel incision is well-healed.  There is swelling and bruising about the Achilles with a palpable defect.  On his  Thompson test there is no longer plantarflexion of the foot compared to the contralateral side.  Sensation is intact in all distributions including the sural distribution.  2+ dorsalis pedis pulse.  Imaging:    None  I personally reviewed and interpreted the radiographs.   Assessment:   61 year old male who is 7 weeks status post left Achilles tendon repair, at this time I am concerned for a rerupture of the repair.  This occurred while shooting basketball and doing dribbling.  At this time I stated that I would like to obtain an MRI so that we can better characterize the tear.  At this time I did discuss that unfortunately given the nature of the root tear, I would recommend surgical repair.  I described that I would plan to utilize a larger incision and to again perform a side-to-side repair given the fact that this is a revision.  I am not concerned for any type of  infection.  We did discuss possible nonoperative management although this conversation was very similar to her previous discussion that we had had prior to his initial surgery.  At this time he is quite concerned about his long-term pushoff strength given the fact that he does play basketball as well as his risk for long-term rerupture.  At this time he would like to undergo surgical repair.  I discussed the specific risks associated with revision surgery including infection and failure of fixation.  That effect we will be extremely cautious following this repair.  Plan :    -Plan for MRI stat of the left Achilles tendon followed by left Achilles tendon repair    After a lengthy discussion of treatment options, including risks, benefits, alternatives, complications of surgical and nonsurgical conservative options, the patient elected surgical repair.   The patient  is aware of the material risks  and complications including, but not limited to injury to adjacent structures, neurovascular injury, infection, numbness, bleeding, implant failure, thermal burns, stiffness, persistent pain, failure to heal, disease transmission from allograft, need for further surgery, dislocation, anesthetic risks, blood clots, risks of death,and others. The probabilities of surgical success and failure discussed with patient given their particular co-morbidities.The time and nature of expected rehabilitation and recovery was discussed.The patient's questions were all answered preoperatively.  No barriers to understanding were noted. I explained the natural history of the disease process and Rx rationale.  I explained to the patient what I considered to be reasonable expectations given their personal situation.  The final treatment plan was arrived at through a shared patient decision making process model.       I personally saw and evaluated the patient, and participated in the management and treatment plan.  Vanetta Mulders, MD Attending Physician, Orthopedic Surgery  This document was dictated using Dragon voice recognition software. A reasonable attempt at proof reading has been made to minimize errors.

## 2021-09-10 NOTE — Progress Notes (Signed)
Post Operative Evaluation    Procedure/Date of Surgery: Left Achilles tendon repair 07/17/21  Interval History:    Presents today for follow-up 7 weeks status post left Achilles tendon repair.  Unfortunately he states that he was shooting a basketball at his son's wedding this most recent weekend and felt a pop.  He was wearing normal shoewear at that time.  He experienced significant swelling following that and is here today for further assessment.   PMH/PSH/Family History/Social History/Meds/Allergies:    Past Medical History:  Diagnosis Date   Cancer ([place])    basal and squamous - face and arms   High cholesterol    Past Surgical History:  Procedure Laterality Date   ACHILLES TENDON SURGERY Left 07/17/2021   Procedure: LEFT ACHILLES TENDON REPAIR;  Surgeon: Vanetta Mulders, MD;  Location: [facility];  Service: Orthopedics;  Laterality: Left;  with regional block   COLONOSCOPY     MOHS SURGERY     x 2   TOTAL HIP ARTHROPLASTY Right    Social History   Socioeconomic History   Marital status: Married    Spouse name: Not on file   Number of children: Not on file   Years of education: Not on file   Highest education level: Not on file  Occupational History   Not on file  Tobacco Use   Smoking status: Former    Types: Cigarettes   Smokeless tobacco: Never  Vaping Use   Vaping Use: Never used  Substance and Sexual Activity   Alcohol use: Yes    Comment: occasional beer or scotch   Drug use: Never   Sexual activity: Not on file  Other Topics Concern   Not on file  Social History Narrative   Not on file   Social Determinants of Health   Financial Resource Strain: Not on file  Food Insecurity: Not on file  Transportation Needs: Not on file  Physical Activity: Not on file  Stress: Not on file  Social Connections: Not on file   No family history on file. No Known Allergies Current Outpatient Medications  Medication Sig Dispense  Refill   aspirin EC 325 MG tablet Take 1 tablet (325 mg total) by mouth daily. 30 tablet 0   aspirin EC 81 MG tablet Take 81 mg by mouth daily. Swallow whole.     atorvastatin (LIPITOR) 10 MG tablet Take 10 mg by mouth daily.     ibuprofen (ADVIL) 200 MG tablet Take 400-600 mg by mouth every 6 (six) hours as needed for moderate pain.     Multiple Vitamin (MULTIVITAMIN WITH MINERALS) TABS tablet Take 1 tablet by mouth daily.     oxyCODONE (OXY IR/ROXICODONE) 5 MG immediate release tablet Take 1 tablet (5 mg total) by mouth every 4 (four) hours as needed (severe pain). 20 tablet 0   VITAMIN D PO Take 1 capsule by mouth daily.     No current facility-administered medications for this visit.   No results found.  Review of Systems:   A ROS was performed including pertinent positives and negatives as documented in the HPI.   Musculoskeletal Exam:    There were no vitals taken for this visit.  Left posterior heel incision is well-healed.  There is swelling and bruising about the Achilles with a palpable defect.  On his  Thompson test there is no longer plantarflexion of the foot compared to the contralateral side.  Sensation is intact in all distributions including the sural distribution.  2+ dorsalis pedis pulse.  Imaging:    None  I personally reviewed and interpreted the radiographs.   Assessment:   61 year old male who is 7 weeks status post left Achilles tendon repair, at this time I am concerned for a rerupture of the repair.  This occurred while shooting basketball and doing dribbling.  At this time I stated that I would like to obtain an MRI so that we can better characterize the tear.  At this time I did discuss that unfortunately given the nature of the root tear, I would recommend surgical repair.  I described that I would plan to utilize a larger incision and to again perform a side-to-side repair given the fact that this is a revision.  I am not concerned for any type of  infection.  We did discuss possible nonoperative management although this conversation was very similar to her previous discussion that we had had prior to his initial surgery.  At this time he is quite concerned about his long-term pushoff strength given the fact that he does play basketball as well as his risk for long-term rerupture.  At this time he would like to undergo surgical repair.  I discussed the specific risks associated with revision surgery including infection and failure of fixation.  That effect we will be extremely cautious following this repair.  Plan :    -Plan for MRI stat of the left Achilles tendon followed by left Achilles tendon repair    After a lengthy discussion of treatment options, including risks, benefits, alternatives, complications of surgical and nonsurgical conservative options, the patient elected surgical repair.   The patient  is aware of the material risks  and complications including, but not limited to injury to adjacent structures, neurovascular injury, infection, numbness, bleeding, implant failure, thermal burns, stiffness, persistent pain, failure to heal, disease transmission from allograft, need for further surgery, dislocation, anesthetic risks, blood clots, risks of death,and others. The probabilities of surgical success and failure discussed with patient given their particular co-morbidities.The time and nature of expected rehabilitation and recovery was discussed.The patient's questions were all answered preoperatively.  No barriers to understanding were noted. I explained the natural history of the disease process and Rx rationale.  I explained to the patient what I considered to be reasonable expectations given their personal situation.  The final treatment plan was arrived at through a shared patient decision making process model.       I personally saw and evaluated the patient, and participated in the management and treatment plan.  Vanetta Mulders, MD Attending Physician, Orthopedic Surgery  This document was dictated using Dragon voice recognition software. A reasonable attempt at proof reading has been made to minimize errors.

## 2021-09-11 ENCOUNTER — Encounter (HOSPITAL_COMMUNITY): Payer: Self-pay | Admitting: Orthopaedic Surgery

## 2021-09-11 ENCOUNTER — Other Ambulatory Visit: Payer: Self-pay

## 2021-09-11 NOTE — Progress Notes (Addendum)
Spoke with pt for pre-op call. I talked with him in June when he came in for surgery on same left achilles tendon. He states nothing has changed with his medical and surgical history.   Shower instructions given to pt and he voiced understanding.   Pt is an "add-on case". I instructed him to call the OR desk at 8 AM Thursday to get the official OR start time. He knows to arrive 2 hours prior to the OR start time and to stop drinking liquids 3 hours prior to OR start time.

## 2021-09-12 NOTE — Addendum Note (Signed)
Addended by: Raynelle Fanning A on: 09/12/2021 08:13 AM   Modules accepted: Orders

## 2021-09-13 ENCOUNTER — Ambulatory Visit (HOSPITAL_COMMUNITY): Payer: BC Managed Care – PPO | Admitting: Certified Registered"

## 2021-09-13 ENCOUNTER — Ambulatory Visit (HOSPITAL_COMMUNITY)
Admission: RE | Admit: 2021-09-13 | Discharge: 2021-09-13 | Disposition: A | Discharge status: Home / Self Care | Payer: BC Managed Care – PPO | Source: Ambulatory Visit | Attending: Orthopaedic Surgery | Admitting: Orthopaedic Surgery

## 2021-09-13 ENCOUNTER — Other Ambulatory Visit: Payer: Self-pay

## 2021-09-13 ENCOUNTER — Encounter (HOSPITAL_COMMUNITY)
Admission: RE | Disposition: A | Discharge status: Home / Self Care | Payer: Self-pay | Source: Ambulatory Visit | Attending: Orthopaedic Surgery

## 2021-09-13 ENCOUNTER — Encounter (HOSPITAL_COMMUNITY): Payer: Self-pay | Admitting: Orthopaedic Surgery

## 2021-09-13 DIAGNOSIS — S86012A Strain of left Achilles tendon, initial encounter: Secondary | ICD-10-CM | POA: Insufficient documentation

## 2021-09-13 DIAGNOSIS — Y9367 Activity, basketball: Secondary | ICD-10-CM | POA: Diagnosis not present

## 2021-09-13 DIAGNOSIS — Z87891 Personal history of nicotine dependence: Secondary | ICD-10-CM | POA: Diagnosis not present

## 2021-09-13 HISTORY — PX: ACHILLES TENDON SURGERY: SHX542

## 2021-09-13 LAB — CBC
HCT: 42.8 % (ref 39.0–52.0)
Hemoglobin: 14.8 g/dL (ref 13.0–17.0)
MCH: 31.4 pg (ref 26.0–34.0)
MCHC: 34.6 g/dL (ref 30.0–36.0)
MCV: 90.7 fL (ref 80.0–100.0)
Platelets: 250 10*3/uL (ref 150–400)
RBC: 4.72 MIL/uL (ref 4.22–5.81)
RDW: 12.6 % (ref 11.5–15.5)
WBC: 4.9 10*3/uL (ref 4.0–10.5)
nRBC: 0 % (ref 0.0–0.2)

## 2021-09-13 LAB — SURGICAL PCR SCREEN
MRSA, PCR: NEGATIVE
Staphylococcus aureus: NEGATIVE

## 2021-09-13 SURGERY — REPAIR, TENDON, ACHILLES
Anesthesia: General | Laterality: Left

## 2021-09-13 MED ORDER — ORAL CARE MOUTH RINSE: 15.0000 mL | Freq: Once | OROMUCOSAL | Status: AC

## 2021-09-13 MED ORDER — CEFAZOLIN SODIUM-DEXTROSE 2-4 GM/100ML-% IV SOLN
2.0000 g | INTRAVENOUS | Status: AC
  Administered 2021-09-13: 2 g via INTRAVENOUS

## 2021-09-13 MED ORDER — VANCOMYCIN HCL 1000 MG IV SOLR
INTRAVENOUS | Status: DC | PRN
  Administered 2021-09-13: 1000 mg via TOPICAL

## 2021-09-13 MED ORDER — CLONIDINE HCL (ANALGESIA) 100 MCG/ML EP SOLN
EPIDURAL | Status: DC | PRN
  Administered 2021-09-13: 100 ug

## 2021-09-13 MED ORDER — KETOROLAC TROMETHAMINE 30 MG/ML IJ SOLN: 30.0000 mg | Freq: Once | INTRAMUSCULAR | Status: DC | PRN

## 2021-09-13 MED ORDER — CEFAZOLIN SODIUM-DEXTROSE 2-4 GM/100ML-% IV SOLN
INTRAVENOUS | Status: AC
  Filled 2021-09-13: qty 100

## 2021-09-13 MED ORDER — GABAPENTIN 300 MG PO CAPS: 300.0000 mg | ORAL_CAPSULE | Freq: Once | ORAL | Status: AC

## 2021-09-13 MED ORDER — FENTANYL CITRATE (PF) 100 MCG/2ML IJ SOLN
INTRAMUSCULAR | Status: AC
  Filled 2021-09-13: qty 2

## 2021-09-13 MED ORDER — LACTATED RINGERS IV SOLN: INTRAVENOUS | Status: DC

## 2021-09-13 MED ORDER — ROPIVACAINE HCL 7.5 MG/ML IJ SOLN
INTRAMUSCULAR | Status: DC | PRN
  Administered 2021-09-13 (×4): 5 mL via PERINEURAL

## 2021-09-13 MED ORDER — VANCOMYCIN HCL 1000 MG IV SOLR
INTRAVENOUS | Status: AC
  Filled 2021-09-13: qty 20

## 2021-09-13 MED ORDER — CHLORHEXIDINE GLUCONATE 0.12 % MT SOLN
OROMUCOSAL | Status: AC
  Administered 2021-09-13: 15 mL via OROMUCOSAL
  Filled 2021-09-13: qty 15

## 2021-09-13 MED ORDER — SUGAMMADEX SODIUM 200 MG/2ML IV SOLN
INTRAVENOUS | Status: DC | PRN
  Administered 2021-09-13: 200 mg via INTRAVENOUS

## 2021-09-13 MED ORDER — 0.9 % SODIUM CHLORIDE (POUR BTL) OPTIME
TOPICAL | Status: DC | PRN
  Administered 2021-09-13: 1000 mL

## 2021-09-13 MED ORDER — FENTANYL CITRATE (PF) 100 MCG/2ML IJ SOLN: 25.0000 ug | INTRAMUSCULAR | Status: DC | PRN

## 2021-09-13 MED ORDER — ACETAMINOPHEN 500 MG PO TABS
ORAL_TABLET | ORAL | Status: AC
  Administered 2021-09-13: 1000 mg via ORAL
  Filled 2021-09-13: qty 2

## 2021-09-13 MED ORDER — FENTANYL CITRATE (PF) 100 MCG/2ML IJ SOLN
INTRAMUSCULAR | Status: DC | PRN
  Administered 2021-09-13 (×2): 50 ug via INTRAVENOUS

## 2021-09-13 MED ORDER — ACETAMINOPHEN 500 MG PO TABS: 1000.0000 mg | ORAL_TABLET | Freq: Once | ORAL | Status: AC

## 2021-09-13 MED ORDER — ONDANSETRON HCL 4 MG/2ML IJ SOLN: 4.0000 mg | Freq: Once | INTRAMUSCULAR | Status: DC | PRN

## 2021-09-13 MED ORDER — MEPERIDINE HCL 25 MG/ML IJ SOLN: 6.2500 mg | INTRAMUSCULAR | Status: DC | PRN

## 2021-09-13 MED ORDER — ACETAMINOPHEN 325 MG PO TABS: 325.0000 mg | ORAL_TABLET | ORAL | Status: DC | PRN

## 2021-09-13 MED ORDER — TRANEXAMIC ACID-NACL 1000-0.7 MG/100ML-% IV SOLN
INTRAVENOUS | Status: AC
  Filled 2021-09-13: qty 100

## 2021-09-13 MED ORDER — DEXAMETHASONE SODIUM PHOSPHATE 10 MG/ML IJ SOLN
INTRAMUSCULAR | Status: DC | PRN
  Administered 2021-09-13: 10 mg

## 2021-09-13 MED ORDER — ACETAMINOPHEN 160 MG/5ML PO SOLN: 325.0000 mg | ORAL | Status: DC | PRN

## 2021-09-13 MED ORDER — MIDAZOLAM HCL 2 MG/2ML IJ SOLN
INTRAMUSCULAR | Status: AC
  Administered 2021-09-13: 2 mg via INTRAVENOUS
  Filled 2021-09-13: qty 4

## 2021-09-13 MED ORDER — ONDANSETRON HCL 4 MG/2ML IJ SOLN
INTRAMUSCULAR | Status: DC | PRN
  Administered 2021-09-13: 4 mg via INTRAVENOUS

## 2021-09-13 MED ORDER — ROCURONIUM BROMIDE 10 MG/ML (PF) SYRINGE
PREFILLED_SYRINGE | INTRAVENOUS | Status: DC | PRN
  Administered 2021-09-13: 70 mg via INTRAVENOUS

## 2021-09-13 MED ORDER — MIDAZOLAM HCL 2 MG/2ML IJ SOLN
2.0000 mg | Freq: Once | INTRAMUSCULAR | Status: AC
Start: 2021-09-13 — End: 2021-09-13

## 2021-09-13 MED ORDER — CHLORHEXIDINE GLUCONATE 0.12 % MT SOLN: 15.0000 mL | Freq: Once | OROMUCOSAL | Status: AC

## 2021-09-13 MED ORDER — TRANEXAMIC ACID-NACL 1000-0.7 MG/100ML-% IV SOLN
1000.0000 mg | INTRAVENOUS | Status: AC
  Administered 2021-09-13: 1000 mg via INTRAVENOUS

## 2021-09-13 MED ORDER — LIDOCAINE 2% (20 MG/ML) 5 ML SYRINGE
INTRAMUSCULAR | Status: DC | PRN
  Administered 2021-09-13: 100 mg via INTRAVENOUS

## 2021-09-13 MED ORDER — PROPOFOL 10 MG/ML IV BOLUS
INTRAVENOUS | Status: DC | PRN
  Administered 2021-09-13: 200 mg via INTRAVENOUS

## 2021-09-13 MED ORDER — GABAPENTIN 300 MG PO CAPS
ORAL_CAPSULE | ORAL | Status: AC
  Administered 2021-09-13: 300 mg via ORAL
  Filled 2021-09-13: qty 1

## 2021-09-13 MED ORDER — FENTANYL CITRATE (PF) 100 MCG/2ML IJ SOLN
INTRAMUSCULAR | Status: AC
  Administered 2021-09-13: 100 ug via INTRAVENOUS
  Filled 2021-09-13: qty 4

## 2021-09-13 MED ORDER — FENTANYL CITRATE (PF) 100 MCG/2ML IJ SOLN: 100.0000 ug | Freq: Once | INTRAMUSCULAR | Status: AC

## 2021-09-13 SURGICAL SUPPLY — 49 items
BAG COUNTER SPONGE SURGICOUNT (BAG) ×2 IMPLANT
BNDG COHESIVE 6X5 TAN STRL LF (GAUZE/BANDAGES/DRESSINGS) ×3 IMPLANT
BNDG ELASTIC 4X5.8 VLCR STR LF (GAUZE/BANDAGES/DRESSINGS) ×2 IMPLANT
BNDG ESMARK 4X9 LF (GAUZE/BANDAGES/DRESSINGS) IMPLANT
CANISTER SUCT 3000ML PPV (MISCELLANEOUS) ×2 IMPLANT
COVER SURGICAL LIGHT HANDLE (MISCELLANEOUS) ×3 IMPLANT
CUFF TOURN SGL QUICK 34 (TOURNIQUET CUFF) ×1
CUFF TRNQT CYL 34X4.125X (TOURNIQUET CUFF) IMPLANT
DRAPE U-SHAPE 47X51 STRL (DRAPES) ×2 IMPLANT
DRSG ADAPTIC 3X8 NADH LF (GAUZE/BANDAGES/DRESSINGS) ×2 IMPLANT
DURAPREP 26ML APPLICATOR (WOUND CARE) ×2 IMPLANT
ELECT REM PT RETURN 9FT ADLT (ELECTROSURGICAL) ×2
ELECTRODE REM PT RTRN 9FT ADLT (ELECTROSURGICAL) ×1 IMPLANT
GAUZE SPONGE 4X4 12PLY STRL (GAUZE/BANDAGES/DRESSINGS) ×2 IMPLANT
GLOVE SRG 8 PF TXTR STRL LF DI (GLOVE) ×1 IMPLANT
GLOVE SURG ENC MOIS LTX SZ6 (GLOVE) ×2 IMPLANT
GLOVE SURG SYN 7.5  E (GLOVE) ×3
GLOVE SURG SYN 7.5 E (GLOVE) ×3 IMPLANT
GLOVE SURG SYN 7.5 PF PI (GLOVE) ×3 IMPLANT
GLOVE SURG UNDER POLY LF SZ6.5 (GLOVE) ×2 IMPLANT
GLOVE SURG UNDER POLY LF SZ8 (GLOVE) ×1
GOWN STRL REUS W/ TWL XL LVL3 (GOWN DISPOSABLE) ×3 IMPLANT
GOWN STRL REUS W/TWL XL LVL3 (GOWN DISPOSABLE) ×3
GRAFT TISS 20X25 1 THK DERM (Tissue) IMPLANT
KIT TURNOVER KIT B (KITS) ×2 IMPLANT
NDL SUT .5 MAYO 1.404X.05X (NEEDLE) ×1 IMPLANT
NEEDLE MAYO TAPER (NEEDLE) ×1
NS IRRIG 1000ML POUR BTL (IV SOLUTION) ×2 IMPLANT
PACK ORTHO EXTREMITY (CUSTOM PROCEDURE TRAY) ×2 IMPLANT
PAD ARMBOARD 7.5X6 YLW CONV (MISCELLANEOUS) ×5 IMPLANT
PAD CAST 4YDX4 CTTN HI CHSV (CAST SUPPLIES) IMPLANT
PADDING CAST COTTON 4X4 STRL (CAST SUPPLIES) ×2
SLEEVE SCD COMPRESS KNEE MED (STOCKING) ×1 IMPLANT
SPONGE T-LAP 18X18 ~~LOC~~+RFID (SPONGE) ×1 IMPLANT
SUT ETHILON 2 0 PSLX (SUTURE) ×2 IMPLANT
SUT ETHILON 3 0 PS 1 (SUTURE) ×4 IMPLANT
SUT FIBERWIRE #2 38 T-5 BLUE (SUTURE)
SUT VIC AB 0 CT1 27 (SUTURE) ×2
SUT VIC AB 0 CT1 27XBRD ANBCTR (SUTURE) IMPLANT
SUT VIC AB 0 CTX 27 (SUTURE) ×1 IMPLANT
SUTURE FIBERWR #2 38 T-5 BLUE (SUTURE) ×2 IMPLANT
SUTURE TAPE 1.3 40 TPR END (SUTURE) IMPLANT
SUTURETAPE 1.3 40 TPR END (SUTURE) ×6
TISSUE ARTHROFLEX THICK 4 (Tissue) ×2 IMPLANT
TOWEL GREEN STERILE (TOWEL DISPOSABLE) ×2 IMPLANT
TOWEL GREEN STERILE FF (TOWEL DISPOSABLE) ×2 IMPLANT
TUBE CONNECTING 12X1/4 (SUCTIONS) ×2 IMPLANT
WATER STERILE IRR 1000ML POUR (IV SOLUTION) ×1 IMPLANT
YANKAUER SUCT BULB TIP NO VENT (SUCTIONS) ×2 IMPLANT

## 2021-09-13 NOTE — Anesthesia Procedure Notes (Signed)
Procedure Name: Intubation Date/Time: 09/13/2021 4:24 PM  Performed by: Reeves Dam, CRNAPre-anesthesia Checklist: Patient identified, Patient being monitored, Timeout performed, Emergency Drugs available and Suction available Patient Re-evaluated:Patient Re-evaluated prior to induction Oxygen Delivery Method: Circle system utilized Preoxygenation: Pre-oxygenation with 100% oxygen Induction Type: IV induction Ventilation: Mask ventilation without difficulty Laryngoscope Size: 3 and Miller Grade View: Grade I Tube type: Oral Tube size: 7.5 mm Number of attempts: 1 Airway Equipment and Method: Stylet Placement Confirmation: ETT inserted through vocal cords under direct vision, positive ETCO2 and breath sounds checked- equal and bilateral Secured at: 24 cm Tube secured with: Tape Dental Injury: Teeth and Oropharynx as per pre-operative assessment

## 2021-09-13 NOTE — Op Note (Addendum)
Date of Surgery: 09/13/2021  INDICATIONS: Victor Powers is a 61 y.o.-year-old male with a left retear of his Achilles tendon after playing basketball with his nephew while at his son's wedding.  I discussed the risks and benefits of revision surgery and he elected primary repair.  The risk and benefits of the procedure were discussed in detail and documented in the pre-operative evaluation.   PREOPERATIVE DIAGNOSIS: 1.  Left Achilles tendon rupture  POSTOPERATIVE DIAGNOSIS: Same.  PROCEDURE: 1.  Left Achilles tendon repair with collagen patch augmentation 2.  Sural nerve neuroplasty  SURGEON: Victor Pax MD  ASSISTANT: Raynelle Fanning, ATC  ANESTHESIA:  general  IV FLUIDS AND URINE: See anesthesia record.  ANTIBIOTICS: Ancef 2 g  ESTIMATED BLOOD LOSS:  25 mL.  IMPLANTS:  Implant Name Type Inv. Item Serial No. Manufacturer Lot No. LRB No. Used Action  TISSUE ARTHROFLEX THICK 4 - [id number] Tissue TISSUE ARTHROFLEX THICK 4 [id number] [employer]  Left 1 Implanted    DRAINS: None  CULTURES: None  COMPLICATIONS: none  DESCRIPTION OF PROCEDURE:   The patient was identified in the preoperative holding area.  The site was marked according universal protocol with nursing.  Anesthesia subsequently performed a peripheral nerve block.  He was taken back to the operating room.  Antibiotics were given 1 hour prior to skin incision.  He was placed in the prone position with all bony prominences padded.  He was prepped and draped in the usual sterile fashion.  Final timeout was again performed.  Tourniquet was applied but not inflated.  A standard midline incision to the Achilles was utilized with 15 blade.  This was centralized over the palpable defect.  At this time the peritenon and sural nerve were both identified.  The sural nerve was protected throughout the case.  15 blade was used to incise centrally through the peritenon.  Full-thickness peritenon flaps were then elevated to  expose the underlying torn Achilles tendon.  These were appropriately mobilized using Allis clamps and Metzenbaum scissors.  I was able to mobilize them to close the gap.  A 1.3 suture tape was then used in a Krakw fashion up and down the Krakw tendon proximally for a total of 4 limbs.  This was done for the distal Achilles tendon as well.  At this time the wounds were thoroughly irrigated.  The nonviable ends of the tendon were debrided.  While an assistant held the sutures together to bring together the tendon edges the sutures were subsequently tied.  Care was taken to bury the knots.  A running suture tape was then performed in a epitendinous circumferential fashion around the repair.  This provided excellent apposition of the tendon with a plantarflex foot on Thompson testing.  The wound was again thoroughly irrigated.  Given the fact that this was a rerupture and there was some poor appearing collagen around the tendon edges, the decision was made to use an arthroflex graft 1 mm thick.  This was placed over the repair and 0 Vicryl was used to suture around the 4 edges of this.  This was done again in a buried fashion.  Following this 0 Vicryl was used to repair the peritenon around the Achilles tendon.  This resulted in complete closure of the peritenon.  Vancomycin powder was again utilized.  At this time the wound was closed superficially with 3-0 nylon in a simple fashion.  Dressing was then placed with Xeroform gauze Webril Ace wrap and the patient was placed  back in his boot with a total of 4 heel lifts.  All counts were correct at the end of the case there are no complications.  The patient was woken up and taken to the PACU.    POSTOPERATIVE PLAN: He will be nonweightbearing for a total of 2 weeks in his cam boot.  I will plan to use a more standard and delayed Achilles rehab protocol with him this time.  He will be strongly advised against basketball even drills for a minimum of 3 months.  We  will plan to start him on aspirin for DVT prophylaxis postop.  I will plan to see him back in 2 weeks for suture removal and wound check.  Victor Pax, MD 5:50 PM

## 2021-09-13 NOTE — Interval H&P Note (Signed)
History and Physical Interval Note:  09/13/2021 2:56 PM  Victor Powers  has presented today for surgery, with the diagnosis of LEFT ACHILLES TEAR.  The various methods of treatment have been discussed with the patient and family. After consideration of risks, benefits and other options for treatment, the patient has consented to  Procedure(s): LEFT ACHILLES TENDON REPAIR (Left) as a surgical intervention.  The patient's history has been reviewed, patient examined, no change in status, stable for surgery.  I have reviewed the patient's chart and labs.  Questions were answered to the patient's satisfaction.     Vanetta Mulders

## 2021-09-13 NOTE — Anesthesia Procedure Notes (Signed)
Anesthesia Regional Block: Popliteal block   Pre-Anesthetic Checklist: , timeout performed,  Correct Patient, Correct Site, Correct Laterality,  Correct Procedure, Correct Position, site marked,  Risks and benefits discussed,  Surgical consent,  Pre-op evaluation,  At surgeon's request and post-op pain management  Laterality: Lower and Left  Prep: chloraprep       Needles:  Injection technique: Single-shot  Needle Type: Echogenic Stimulator Needle     Needle Length: 9cm  Needle Gauge: 20   Needle insertion depth: 1.5 cm   Additional Needles:   Procedures:,,,, ultrasound used (permanent image in chart),,    Narrative:  Start time: 09/13/2021 2:05 PM End time: 09/13/2021 2:11 PM Injection made incrementally with aspirations every 5 mL.  Performed by: Personally  Anesthesiologist: Lyn Hollingshead, MD

## 2021-09-13 NOTE — Brief Op Note (Signed)
   Brief Op Note  Date of Surgery: 09/13/2021  Preoperative Diagnosis: LEFT ACHILLES TEAR  Postoperative Diagnosis: same  Procedure: Procedure(s): LEFT ACHILLES TENDON REPAIR  Implants: Implant Name Type Inv. Item Serial No. Manufacturer Lot No. LRB No. Used Action  TISSUE ARTHROFLEX THICK 4 - [id number] Tissue TISSUE ARTHROFLEX THICK 4 [id number] [employer]  Left 1 Implanted    Surgeons: Surgeon(s): Vanetta Mulders, MD  Anesthesia: Regional    Estimated Blood Loss: See anesthesia record  Complications: None  Condition to PACU: Stable  Yevonne Pax, MD 09/13/2021 5:49 PM

## 2021-09-13 NOTE — Anesthesia Postprocedure Evaluation (Signed)
Anesthesia Post Note  Patient: Victor Powers  Procedure(s) Performed: LEFT ACHILLES TENDON REPAIR (Left)     Patient location during evaluation: PACU Anesthesia Type: General Level of consciousness: awake Pain management: pain level controlled Vital Signs Assessment: post-procedure vital signs reviewed and stable Respiratory status: spontaneous breathing Cardiovascular status: stable Postop Assessment: no apparent nausea or vomiting Anesthetic complications: no   No notable events documented.  Last Vitals:  Vitals:   09/13/21 1815 09/13/21 1830  BP: (!) 143/85 (!) 146/80  Pulse: (!) 51 (!) 53  Resp: 12 12  Temp:  36.7 C  SpO2: 98% 98%    Last Pain:  Vitals:   09/13/21 1800  TempSrc:   PainSc: 0-No pain                 Huston Foley

## 2021-09-13 NOTE — Discharge Instructions (Signed)
     Discharge Instructions    Attending Surgeon: Vanetta Mulders, MD Office Phone Number: [phone]   Diagnosis and Procedures:    Surgeries Performed: Left revision Achilles tendon repair  Discharge Plan:    Diet: Resume usual diet. Begin with light or bland foods.  Drink plenty of fluids.  Activity:  Nonweightbearing for a total of 2 weeks. You are advised to go home directly from the hospital or surgical center. Restrict your activities.  GENERAL INSTRUCTIONS: 1.  Please apply ice to your wound to help with swelling and inflammation. This will improve your comfort and your overall recovery following surgery.     2. Please call Dr. Eddie Dibbles office at [phone] with questions Monday-Friday during business hours. If no one answers, please leave a message and someone should get back to the patient within 24 hours. For emergencies please call 911 or proceed to the emergency room.   3. Patient to notify surgical team if experiences any of the following: Bowel/Bladder dysfunction, uncontrolled pain, nerve/muscle weakness, incision with increased drainage or redness, nausea/vomiting and Fever greater than 101.0 F.  Be alert for signs of infection including redness, streaking, odor, fever or chills. Be alert for excessive pain or bleeding and notify your surgeon immediately.  WOUND INSTRUCTIONS:   Leave your dressing, cast, or splint in place until your post operative visit.  Keep it clean and dry.  Always keep the incision clean and dry until the staples/sutures are removed. If there is no drainage from the incision you should keep it open to air. If there is drainage from the incision you must keep it covered at all times until the drainage stops  Do not soak in a bath tub, hot tub, pool, lake or other body of water until 21 days after your surgery and your incision is completely dry and healed.  If you have removable sutures (or staples) they must be removed 10-14 days  (unless otherwise instructed) from the day of your surgery.     1)  Elevate the extremity as much as possible.  2)  Keep the dressing clean and dry.  3)  Please call us if the dressing becomes wet or dirty.  4)  If you are experiencing worsening pain or worsening swelling, please call.     MEDICATIONS: Resume all previous home medications at the previous prescribed dose and frequency unless otherwise noted Start taking the  pain medications on an as-needed basis as prescribed  Please taper down pain medication over the next week following surgery.  Ideally you should not require a refill of any narcotic pain medication.  Take pain medication with food to minimize nausea. In addition to the prescribed pain medication, you may take over-the-counter pain relievers such as Tylenol.  Do NOT take additional tylenol if your pain medication already has tylenol in it.  Aspirin '325mg'$  daily for four weeks.      FOLLOWUP INSTRUCTIONS: 1. Follow up at the Physical Therapy Clinic 3-4 days following surgery. This appointment should be scheduled unless other arrangements have been made.The Physical Therapy scheduling number is [phone] if an appointment has not already been arranged.  2. Contact Dr. Eddie Dibbles office during office hours at [phone] or the practice after hours line at [phone] for non-emergencies. For medical emergencies call 911.   Discharge Location: Home

## 2021-09-13 NOTE — Anesthesia Preprocedure Evaluation (Signed)
Anesthesia Evaluation  Patient identified by MRN, date of birth, ID band Patient awake    Reviewed: Allergy & Precautions, NPO status , Patient's Chart, lab work & pertinent test results  Airway Mallampati: I  TM Distance: >3 FB Neck ROM: Full    Dental  (+) Teeth Intact, Dental Advisory Given   Pulmonary former smoker,    Pulmonary exam normal breath sounds clear to auscultation       Cardiovascular negative cardio ROS Normal cardiovascular exam Rhythm:Regular Rate:Normal     Neuro/Psych negative neurological ROS  negative psych ROS   GI/Hepatic negative GI ROS, Neg liver ROS,   Endo/Other  negative endocrine ROS  Renal/GU negative Renal ROS     Musculoskeletal negative musculoskeletal ROS (+)   Abdominal Normal abdominal exam  (+)   Peds  Hematology negative hematology ROS (+)   Anesthesia Other Findings   Reproductive/Obstetrics                             Anesthesia Physical  Anesthesia Plan  ASA: 1  Anesthesia Plan: General   Post-op Pain Management: Regional block*   Induction: Intravenous  PONV Risk Score and Plan: 3 and Ondansetron, Dexamethasone and Midazolam  Airway Management Planned: Oral ETT  Additional Equipment: None  Intra-op Plan:   Post-operative Plan: Extubation in OR  Informed Consent: I have reviewed the patients History and Physical, chart, labs and discussed the procedure including the risks, benefits and alternatives for the proposed anesthesia with the patient or authorized representative who has indicated his/her understanding and acceptance.     Dental advisory given  Plan Discussed with: CRNA  Anesthesia Plan Comments:         Anesthesia Quick Evaluation

## 2021-09-13 NOTE — Transfer of Care (Signed)
Immediate Anesthesia Transfer of Care Note  Patient: Victor Powers  Procedure(s) Performed: LEFT ACHILLES TENDON REPAIR (Left)  Patient Location: PACU  Anesthesia Type:General and Regional  Level of Consciousness: awake and drowsy  Airway & Oxygen Therapy: Patient Spontanous Breathing  Post-op Assessment: Report given to RN and Post -op Vital signs reviewed and stable  Post vital signs: Reviewed and stable  Last Vitals:  Vitals Value Taken Time  BP 133/81 09/13/21 1800  Temp    Pulse 61 09/13/21 1804  Resp 21 09/13/21 1804  SpO2 94 % 09/13/21 1804  Vitals shown include unvalidated device data.  Last Pain:  Vitals:   09/13/21 1321  TempSrc:   PainSc: 0-No pain         Complications: No notable events documented.

## 2021-09-14 ENCOUNTER — Encounter (HOSPITAL_COMMUNITY): Payer: Self-pay | Admitting: Orthopaedic Surgery

## 2021-09-17 ENCOUNTER — Other Ambulatory Visit (HOSPITAL_BASED_OUTPATIENT_CLINIC_OR_DEPARTMENT_OTHER): Payer: Self-pay | Admitting: Orthopaedic Surgery

## 2021-09-17 ENCOUNTER — Ambulatory Visit
Admission: RE | Admit: 2021-09-17 | Discharge: 2021-09-17 | Disposition: A | Discharge status: Home / Self Care | Payer: Self-pay | Source: Ambulatory Visit | Attending: Orthopaedic Surgery | Admitting: Orthopaedic Surgery

## 2021-09-17 DIAGNOSIS — R52 Pain, unspecified: Secondary | ICD-10-CM

## 2021-09-26 ENCOUNTER — Ambulatory Visit (INDEPENDENT_AMBULATORY_CARE_PROVIDER_SITE_OTHER): Payer: BC Managed Care – PPO | Admitting: Orthopaedic Surgery

## 2021-09-26 DIAGNOSIS — S86012A Strain of left Achilles tendon, initial encounter: Secondary | ICD-10-CM

## 2021-09-26 NOTE — Progress Notes (Signed)
Post Operative Evaluation    Procedure/Date of Surgery: Left Achilles tendon repair 09/13/21   Interval History:   Presents today for follow-up of the above procedure.  Overall he is doing very well.  He has been compliant with strict nonweightbearing with 4 heel lifts in his boot.  He is here today for further assessment.  Denies any fevers or chills.  Is been compliant with anticoagulation.    PMH/PSH/Family History/Social History/Meds/Allergies:    Past Medical History:  Diagnosis Date   Cancer ([place])    basal and squamous - face and arms   High cholesterol    Past Surgical History:  Procedure Laterality Date   ACHILLES TENDON SURGERY Left 07/17/2021   Procedure: LEFT ACHILLES TENDON REPAIR;  Surgeon: Vanetta Mulders, MD;  Location: [facility];  Service: Orthopedics;  Laterality: Left;  with regional block   ACHILLES TENDON SURGERY Left 09/13/2021   Procedure: LEFT ACHILLES TENDON REPAIR;  Surgeon: Vanetta Mulders, MD;  Location: [facility];  Service: Orthopedics;  Laterality: Left;   COLONOSCOPY     MOHS SURGERY     x 2   TOTAL HIP ARTHROPLASTY Right    Social History   Socioeconomic History   Marital status: Married    Spouse name: Not on file   Number of children: Not on file   Years of education: Not on file   Highest education level: Not on file  Occupational History   Not on file  Tobacco Use   Smoking status: Former    Types: Cigarettes   Smokeless tobacco: Never  Vaping Use   Vaping Use: Never used  Substance and Sexual Activity   Alcohol use: Yes    Comment: occasional beer or scotch   Drug use: Never   Sexual activity: Not on file  Other Topics Concern   Not on file  Social History Narrative   Not on file   Social Determinants of Health   Financial Resource Strain: Not on file  Food Insecurity: Not on file  Transportation Needs: Not on file  Physical Activity: Not on file  Stress: Not on file  Social Connections: Not on  file   No family history on file. No Known Allergies Current Outpatient Medications  Medication Sig Dispense Refill   aspirin EC 325 MG tablet Take 1 tablet (325 mg total) by mouth daily. 30 tablet 0   aspirin EC 81 MG tablet Take 81 mg by mouth daily. Swallow whole.     atorvastatin (LIPITOR) 10 MG tablet Take 10 mg by mouth daily.     Multiple Vitamin (MULTIVITAMIN WITH MINERALS) TABS tablet Take 1 tablet by mouth daily.     oxyCODONE (OXY IR/ROXICODONE) 5 MG immediate release tablet Take 1 tablet (5 mg total) by mouth every 4 (four) hours as needed (severe pain). (Patient taking differently: Take 5 mg by mouth every 4 (four) hours as needed for severe pain.) 20 tablet 0   VITAMIN D PO Take 1 capsule by mouth daily.     No current facility-administered medications for this visit.   No results found.  Review of Systems:   A ROS was performed including pertinent positives and negatives as documented in the HPI.   Musculoskeletal Exam:    There were no vitals taken for this visit.  Left posterior heel incision is  well-appearing without erythema or drainage.  No palpable defect about the Achilles.  Thompson test reveals plantarflexion with squeeze of the gastrocnemius.  Sensation is intact in all distributions including the sural distribution.  2+ dorsalis pedis pulse.  Imaging:    None  I personally reviewed and interpreted the radiographs.   Assessment:   61 year old male who is 2 weeks status post left Achilles tendon repair after a traumatic rupture following initial repair.  At this time I described that we will pursue a more conservative rehab process.  At this time I would like him to switch to 2 heel lifts.  He will plan to be in his boot for an additional 4 weeks.  I will plan to see him back in 2 weeks for reassessment  Plan :    -Return to clinic in 2 weeks for reassessment       I personally saw and evaluated the patient, and participated in the management and  treatment plan.  Vanetta Mulders, MD Attending Physician, Orthopedic Surgery  This document was dictated using Dragon voice recognition software. A reasonable attempt at proof reading has been made to minimize errors.

## 2021-10-10 ENCOUNTER — Ambulatory Visit (INDEPENDENT_AMBULATORY_CARE_PROVIDER_SITE_OTHER): Payer: BC Managed Care – PPO | Admitting: Orthopaedic Surgery

## 2021-10-10 DIAGNOSIS — S86012A Strain of left Achilles tendon, initial encounter: Secondary | ICD-10-CM

## 2021-10-10 NOTE — Progress Notes (Signed)
Post Operative Evaluation    Procedure/Date of Surgery: Left Achilles tendon repair 09/13/21   Interval History:   Presents today 4 weeks status post left Achilles revision repair.  Overall he is doing very well.  He has had to heel lifts in the boot.  He has been compliant with no active plantarflexion of the foot.  He is working on gentle range of motion on his own.  Overall doing very well with no pain about the heel.    PMH/PSH/Family History/Social History/Meds/Allergies:    Past Medical History:  Diagnosis Date   Cancer ([place])    basal and squamous - face and arms   High cholesterol    Past Surgical History:  Procedure Laterality Date   ACHILLES TENDON SURGERY Left 07/17/2021   Procedure: LEFT ACHILLES TENDON REPAIR;  Surgeon: Vanetta Mulders, MD;  Location: [facility];  Service: Orthopedics;  Laterality: Left;  with regional block   ACHILLES TENDON SURGERY Left 09/13/2021   Procedure: LEFT ACHILLES TENDON REPAIR;  Surgeon: Vanetta Mulders, MD;  Location: [facility];  Service: Orthopedics;  Laterality: Left;   COLONOSCOPY     MOHS SURGERY     x 2   TOTAL HIP ARTHROPLASTY Right    Social History   Socioeconomic History   Marital status: Married    Spouse name: Not on file   Number of children: Not on file   Years of education: Not on file   Highest education level: Not on file  Occupational History   Not on file  Tobacco Use   Smoking status: Former    Types: Cigarettes   Smokeless tobacco: Never  Vaping Use   Vaping Use: Never used  Substance and Sexual Activity   Alcohol use: Yes    Comment: occasional beer or scotch   Drug use: Never   Sexual activity: Not on file  Other Topics Concern   Not on file  Social History Narrative   Not on file   Social Determinants of Health   Financial Resource Strain: Not on file  Food Insecurity: Not on file  Transportation Needs: Not on file  Physical Activity: Not on file  Stress: Not on file   Social Connections: Not on file   No family history on file. No Known Allergies Current Outpatient Medications  Medication Sig Dispense Refill   aspirin EC 325 MG tablet Take 1 tablet (325 mg total) by mouth daily. 30 tablet 0   aspirin EC 81 MG tablet Take 81 mg by mouth daily. Swallow whole.     atorvastatin (LIPITOR) 10 MG tablet Take 10 mg by mouth daily.     Multiple Vitamin (MULTIVITAMIN WITH MINERALS) TABS tablet Take 1 tablet by mouth daily.     oxyCODONE (OXY IR/ROXICODONE) 5 MG immediate release tablet Take 1 tablet (5 mg total) by mouth every 4 (four) hours as needed (severe pain). (Patient taking differently: Take 5 mg by mouth every 4 (four) hours as needed for severe pain.) 20 tablet 0   VITAMIN D PO Take 1 capsule by mouth daily.     No current facility-administered medications for this visit.   No results found.  Review of Systems:   A ROS was performed including pertinent positives and negatives as documented in the HPI.   Musculoskeletal Exam:    There were  no vitals taken for this visit.  Left posterior heel incision is healed.  No palpable defect about the Achilles.  Thompson test reveals plantarflexion with squeeze of the gastrocnemius.  Sensation is intact in all distributions including the sural distribution.  2+ dorsalis pedis pulse.  There is decreased gastroc bulk with significant atrophy  Imaging:    None  I personally reviewed and interpreted the radiographs.   Assessment:   61 year old male who is 4 weeks status post left Achilles tendon repair after a traumatic rupture following initial repair.  At this time his wound is completely healed and is overall well-appearing.  I will plan to keep him in a boot with no lift for an additional 2 weeks.  I will see him back in 2 weeks for reassessment we will plan to progress him according to the rehab protocol at that time.  Plan :    -Return to clinic in 2 weeks for reassessment       I personally  saw and evaluated the patient, and participated in the management and treatment plan.  Vanetta Mulders, MD Attending Physician, Orthopedic Surgery  This document was dictated using Dragon voice recognition software. A reasonable attempt at proof reading has been made to minimize errors.

## 2021-10-24 ENCOUNTER — Ambulatory Visit (INDEPENDENT_AMBULATORY_CARE_PROVIDER_SITE_OTHER): Payer: BC Managed Care – PPO | Admitting: Orthopaedic Surgery

## 2021-10-24 DIAGNOSIS — S86012A Strain of left Achilles tendon, initial encounter: Secondary | ICD-10-CM

## 2021-10-24 NOTE — Progress Notes (Signed)
Post Operative Evaluation    Procedure/Date of Surgery: Left Achilles tendon repair 09/13/21   Interval History:   Presents today 6 weeks status post left Achilles tendon repair.  Overall he is doing extremely well.  He has been in his boot.  He is continuing to make slow improvements.  He has been very careful about returning to any excessive exercise.  He is planned to begin physical therapy this upcoming week.    PMH/PSH/Family History/Social History/Meds/Allergies:    Past Medical History:  Diagnosis Date   Cancer ([place])    basal and squamous - face and arms   High cholesterol    Past Surgical History:  Procedure Laterality Date   ACHILLES TENDON SURGERY Left 07/17/2021   Procedure: LEFT ACHILLES TENDON REPAIR;  Surgeon: Vanetta Mulders, MD;  Location: [facility] [place];  Service: Orthopedics;  Laterality: Left;  with regional block   ACHILLES TENDON SURGERY Left 09/13/2021   Procedure: LEFT ACHILLES TENDON REPAIR;  Surgeon: Vanetta Mulders, MD;  Location: [facility] [facility];  Service: Orthopedics;  Laterality: Left;   COLONOSCOPY     MOHS SURGERY     x 2   TOTAL HIP ARTHROPLASTY Right    Social History   Socioeconomic History   Marital status: Married    Spouse name: Not on file   Number of children: Not on file   Years of education: Not on file   Highest education level: Not on file  Occupational History   Not on file  Tobacco Use   Smoking status: Former    Types: Cigarettes   Smokeless tobacco: Never  Vaping Use   Vaping Use: Never used  Substance and Sexual Activity   Alcohol use: Yes    Comment: occasional beer or scotch   Drug use: Never   Sexual activity: Not on file  Other Topics Concern   Not on file  Social History Narrative   Not on file   Social Determinants of Health   Financial Resource Strain: Not on file  Food Insecurity: Not on file  Transportation Needs: Not on file  Physical Activity: Not on file  Stress: Not on file  Social  Connections: Not on file   No family history on file. No Known Allergies Current Outpatient Medications  Medication Sig Dispense Refill   aspirin EC 325 MG tablet Take 1 tablet (325 mg total) by mouth daily. 30 tablet 0   aspirin EC 81 MG tablet Take 81 mg by mouth daily. Swallow whole.     atorvastatin (LIPITOR) 10 MG tablet Take 10 mg by mouth daily.     Multiple Vitamin (MULTIVITAMIN WITH MINERALS) TABS tablet Take 1 tablet by mouth daily.     oxyCODONE (OXY IR/ROXICODONE) 5 MG immediate release tablet Take 1 tablet (5 mg total) by mouth every 4 (four) hours as needed (severe pain). (Patient taking differently: Take 5 mg by mouth every 4 (four) hours as needed for severe pain.) 20 tablet 0   VITAMIN D PO Take 1 capsule by mouth daily.     No current facility-administered medications for this visit.   No results found.  Review of Systems:   A ROS was performed including pertinent positives and negatives as documented in the HPI.   Musculoskeletal Exam:    There were no vitals taken for this visit.  Left posterior heel incision is healed.  No palpable defect about the Achilles.  Thompson test reveals plantarflexion with squeeze of the gastrocnemius.  Sensation is intact in all distributions including the sural distribution.  2+ dorsalis pedis pulse.  There is decreased gastroc bulk with significant atrophy  Imaging:    None  I personally reviewed and interpreted the radiographs.   Assessment:   61 year old male who is 6 weeks status post left Achilles tendon repair.  At this time I have advised that he may go into a normal shoe.  He will advance per my protocol.  All restrictions and limitations were again specifically discussed with him.  Plan :    -Return to clinic in 6 weeks for reassessment      I personally saw and evaluated the patient, and participated in the management and treatment plan.  Vanetta Mulders, MD Attending Physician, Orthopedic Surgery  This  document was dictated using Dragon voice recognition software. A reasonable attempt at proof reading has been made to minimize errors.

## 2021-12-05 ENCOUNTER — Ambulatory Visit (INDEPENDENT_AMBULATORY_CARE_PROVIDER_SITE_OTHER): Payer: BC Managed Care – PPO | Admitting: Orthopaedic Surgery

## 2021-12-05 ENCOUNTER — Encounter (HOSPITAL_BASED_OUTPATIENT_CLINIC_OR_DEPARTMENT_OTHER): Payer: BC Managed Care – PPO | Admitting: Orthopaedic Surgery

## 2021-12-05 DIAGNOSIS — S86012A Strain of left Achilles tendon, initial encounter: Secondary | ICD-10-CM

## 2021-12-05 NOTE — Progress Notes (Signed)
Post Operative Evaluation    Procedure/Date of Surgery: Left Achilles tendon repair 09/13/21   Interval History:   Presents today for follow-up 3 months status post left Achilles tendon repair.  Overall he is doing very well.  He is up to walking 1 mile plus daily without any pain or soreness.  He is continue to work through physical therapy according to the rehab protocol.  He is refrain from basketball at this time.    PMH/PSH/Family History/Social History/Meds/Allergies:    Past Medical History:  Diagnosis Date   Cancer ([place])    basal and squamous - face and arms   High cholesterol    Past Surgical History:  Procedure Laterality Date   ACHILLES TENDON SURGERY Left 07/17/2021   Procedure: LEFT ACHILLES TENDON REPAIR;  Surgeon: Vanetta Mulders, MD;  Location: [facility];  Service: Orthopedics;  Laterality: Left;  with regional block   ACHILLES TENDON SURGERY Left 09/13/2021   Procedure: LEFT ACHILLES TENDON REPAIR;  Surgeon: Vanetta Mulders, MD;  Location: [facility];  Service: Orthopedics;  Laterality: Left;   COLONOSCOPY     MOHS SURGERY     x 2   TOTAL HIP ARTHROPLASTY Right    Social History   Socioeconomic History   Marital status: Married    Spouse name: Not on file   Number of children: Not on file   Years of education: Not on file   Highest education level: Not on file  Occupational History   Not on file  Tobacco Use   Smoking status: Former    Types: Cigarettes   Smokeless tobacco: Never  Vaping Use   Vaping Use: Never used  Substance and Sexual Activity   Alcohol use: Yes    Comment: occasional beer or scotch   Drug use: Never   Sexual activity: Not on file  Other Topics Concern   Not on file  Social History Narrative   Not on file   Social Determinants of Health   Financial Resource Strain: Not on file  Food Insecurity: Not on file  Transportation Needs: Not on file  Physical Activity: Not on file  Stress: Not on file   Social Connections: Not on file   No family history on file. No Known Allergies Current Outpatient Medications  Medication Sig Dispense Refill   aspirin EC 325 MG tablet Take 1 tablet (325 mg total) by mouth daily. 30 tablet 0   aspirin EC 81 MG tablet Take 81 mg by mouth daily. Swallow whole.     atorvastatin (LIPITOR) 10 MG tablet Take 10 mg by mouth daily.     Multiple Vitamin (MULTIVITAMIN WITH MINERALS) TABS tablet Take 1 tablet by mouth daily.     oxyCODONE (OXY IR/ROXICODONE) 5 MG immediate release tablet Take 1 tablet (5 mg total) by mouth every 4 (four) hours as needed (severe pain). (Patient taking differently: Take 5 mg by mouth every 4 (four) hours as needed for severe pain.) 20 tablet 0   VITAMIN D PO Take 1 capsule by mouth daily.     No current facility-administered medications for this visit.   No results found.  Review of Systems:   A ROS was performed including pertinent positives and negatives as documented in the HPI.   Musculoskeletal Exam:    There were no vitals taken for this  visit.  Left posterior heel incision is healed.  No palpable defect about the Achilles.  Thompson test reveals plantarflexion with squeeze of the gastrocnemius.  Sensation is intact in all distributions including the sural distribution.  2+ dorsalis pedis pulse.  Increased gastroc bulk and tone  Imaging:    None  I personally reviewed and interpreted the radiographs.   Assessment:   61 year old male who is 12 weeks status post left Achilles tendon repair.  I did discuss that at this time he will begin the strengthening portion of the rehab protocol.  I did discuss all restrictions and limitations.  At this time I would like him to refrain from basketball.  He will begin a return to light jogging program.  I will plan to see him back in 3 months for reassessment  Plan :    -Return to clinic in 12 weeks for reassessment      I personally saw and evaluated the patient, and  participated in the management and treatment plan.  Vanetta Mulders, MD Attending Physician, Orthopedic Surgery  This document was dictated using Dragon voice recognition software. A reasonable attempt at proof reading has been made to minimize errors.

## 2022-03-06 ENCOUNTER — Ambulatory Visit (INDEPENDENT_AMBULATORY_CARE_PROVIDER_SITE_OTHER): Payer: BC Managed Care – PPO | Admitting: Orthopaedic Surgery

## 2022-03-06 DIAGNOSIS — S86012A Strain of left Achilles tendon, initial encounter: Secondary | ICD-10-CM

## 2022-03-06 NOTE — Progress Notes (Signed)
.                                Post Operative Evaluation    Procedure/Date of Surgery: Left Achilles tendon repair 09/13/21   Interval History:   Presents today for follow-up status post left Achilles revision repair.  Overall he is doing well.  He is now walking several miles on a treadmill without pain.  He has been able to go for a jog outside.  He has not been playing any basketball since his injury.  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   PMH/PSH/Family History/Social History/Meds/Allergies:    Past Medical History:  Diagnosis Date   Cancer ([place])    basal and squamous - face and arms   High cholesterol    Past Surgical History:  Procedure Laterality Date   ACHILLES TENDON SURGERY Left 07/17/2021   Procedure: LEFT ACHILLES TENDON REPAIR;  Surgeon: Vanetta Mulders, MD;  Location: [place] [facility];  Service: Orthopedics;  Laterality: Left;  with regional block   ACHILLES TENDON SURGERY Left 09/13/2021   Procedure: LEFT ACHILLES TENDON REPAIR;  Surgeon: Vanetta Mulders, MD;  Location: [place] [place];  Service: Orthopedics;  Laterality: Left;   COLONOSCOPY     MOHS SURGERY     x 2   TOTAL HIP ARTHROPLASTY Right    Social History   Socioeconomic History   Marital status: Married    Spouse name: Not on file   Number of children: Not on file   Years of education: Not on file   Highest education level: Not on file  Occupational History   Not on file  Tobacco Use   Smoking status: Former    Types: Cigarettes   Smokeless tobacco: Never  Vaping Use   Vaping Use: Never used  Substance and Sexual Activity   Alcohol use: Yes    Comment: occasional beer or scotch   Drug use: Never   Sexual activity: Not on file  Other Topics Concern   Not on file  Social History Narrative   Not on file   Social Determinants of Health   Financial Resource Strain: Not on file  Food Insecurity: Not on file  Transportation Needs: Not on file   Physical Activity: Not on file  Stress: Not on file  Social Connections: Not on file   No family history on file. No Known Allergies Current Outpatient Medications  Medication Sig Dispense Refill   aspirin EC 325 MG tablet Take 1 tablet (325 mg total) by mouth daily. 30 tablet 0   aspirin EC 81 MG tablet Take 81 mg by mouth daily. Swallow whole.     atorvastatin (LIPITOR) 10 MG tablet Take 10 mg by mouth daily.     Multiple Vitamin (MULTIVITAMIN WITH MINERALS) TABS tablet Take 1 tablet by mouth daily.     oxyCODONE (OXY IR/ROXICODONE) 5 MG immediate release tablet Take 1 tablet (5 mg total) by mouth every 4 (four) hours as needed (severe pain). (Patient taking differently: Take 5 mg by mouth every 4 (four) hours as needed for severe pain.) 20 tablet 0   VITAMIN D PO Take 1 capsule by mouth daily.     No current facility-administered medications for this visit.   No results found.  Review of Systems:   A ROS was performed including pertinent positives and negatives as documented in the HPI.   Musculoskeletal Exam:  There were no vitals taken for this visit.  Left posterior heel incision is healed.  No palpable defect about the Achilles.  Thompson test reveals plantarflexion with squeeze of the gastrocnemius.  He does have improved gastroc bulk and tone nearly equal to contralateral side.  Sensation is intact in all distributions including the sural distribution.  2+ dorsalis pedis pulse.    Imaging:    None  I personally reviewed and interpreted the radiographs.   Assessment:   62 year old male who is 6 months status post left Achilles tendon revision repair.  At today's visit he is doing extremely well.  I have advised that he may return to gentle basketball and advance as tolerated.  He will refrain from any pickup games at this point.  I will plan to see him back in 6 months for final check.  All additional limitations and restrictions were specifically discussed. Plan :     -Return to clinic in 6 months for reassessment      I personally saw and evaluated the patient, and participated in the management and treatment plan.  Vanetta Mulders, MD Attending Physician, Orthopedic Surgery  This document was dictated using Dragon voice recognition software. A reasonable attempt at proof reading has been made to minimize errors.

## 2022-08-21 ENCOUNTER — Ambulatory Visit (HOSPITAL_BASED_OUTPATIENT_CLINIC_OR_DEPARTMENT_OTHER): Payer: BC Managed Care – PPO | Admitting: Orthopaedic Surgery

## 2022-08-28 ENCOUNTER — Ambulatory Visit (HOSPITAL_BASED_OUTPATIENT_CLINIC_OR_DEPARTMENT_OTHER): Payer: BC Managed Care – PPO | Admitting: Orthopaedic Surgery

## 2022-08-28 DIAGNOSIS — S86012A Strain of left Achilles tendon, initial encounter: Secondary | ICD-10-CM

## 2022-08-28 NOTE — Progress Notes (Signed)
.                                Post Operative Evaluation    Procedure/Date of Surgery: Left Achilles tendon repair 09/13/21   Interval History:   Presents today for follow-up status post left Achilles revision repair.  Today's visit he is doing extremely well.  He is now been able to walk longer distances and get back to playing basketball without any pain or weakness.  He does still have some numbness in the bottom of the foot  PMH/PSH/Family History/Social History/Meds/Allergies:    Past Medical History:  Diagnosis Date   Cancer (HCC)    basal and squamous - face and arms   High cholesterol    Past Surgical History:  Procedure Laterality Date   ACHILLES TENDON SURGERY Left 07/17/2021   Procedure: LEFT ACHILLES TENDON REPAIR;  Surgeon: Huel Cote, MD;  Location: [facility] OR;  Service: Orthopedics;  Laterality: Left;  with regional block   ACHILLES TENDON SURGERY Left 09/13/2021   Procedure: LEFT ACHILLES TENDON REPAIR;  Surgeon: Huel Cote, MD;  Location: [facility] OR;  Service: Orthopedics;  Laterality: Left;   COLONOSCOPY     MOHS SURGERY     x 2   TOTAL HIP ARTHROPLASTY Right    Social History   Socioeconomic History   Marital status: Married    Spouse name: Not on file   Number of children: Not on file   Years of education: Not on file   Highest education level: Not on file  Occupational History   Not on file  Tobacco Use   Smoking status: Former    Types: Cigarettes   Smokeless tobacco: Never  Vaping Use   Vaping status: Never Used  Substance and Sexual Activity   Alcohol use: Yes    Comment: occasional beer or scotch   Drug use: Never   Sexual activity: Not on file  Other Topics Concern   Not on file  Social History Narrative   Not on file   Social Determinants of Health   Financial Resource Strain: Not on file  Food Insecurity: Not on file  Transportation Needs: Not on file  Physical Activity: Not on file  Stress: Not on file  Social Connections:  Not on file   No family history on file. No Known Allergies Current Outpatient Medications  Medication Sig Dispense Refill   aspirin EC 325 MG tablet Take 1 tablet (325 mg total) by mouth daily. 30 tablet 0   aspirin EC 81 MG tablet Take 81 mg by mouth daily. Swallow whole.     atorvastatin (LIPITOR) 10 MG tablet Take 10 mg by mouth daily.     Multiple Vitamin (MULTIVITAMIN WITH MINERALS) TABS tablet Take 1 tablet by mouth daily.     oxyCODONE (OXY IR/ROXICODONE) 5 MG immediate release tablet Take 1 tablet (5 mg total) by mouth every 4 (four) hours as needed (severe pain). (Patient taking differently: Take 5 mg by mouth every 4 (four) hours as needed for severe pain.) 20 tablet 0   VITAMIN D PO Take 1 capsule by mouth daily.     No current facility-administered medications for this visit.   No results found.  Review of Systems:   A ROS was performed including pertinent positives and negatives as documented in the HPI.   Musculoskeletal Exam:    There were no vitals taken for this visit.  Left posterior heel incision  is healed.  No palpable defect about the Achilles.  Thompson test reveals plantarflexion with squeeze of the gastrocnemius.  He does have improved gastroc bulk and tone nearly equal to contralateral side.  Sensation is intact in all distributions including the sural distribution.  2+ dorsalis pedis pulse.    Imaging:    None  I personally reviewed and interpreted the radiographs.   Assessment:   62 year old male who is 1 year status post Achilles tendon revision repair overall doing extremely well.  His strength and pain normalized.  I will plan to see him back as needed Plan :    -Return to clinic as needed      I personally saw and evaluated the patient, and participated in the management and treatment plan.  Huel Cote, MD Attending Physician, Orthopedic Surgery  This document was dictated using Dragon voice recognition software. A reasonable  attempt at proof reading has been made to minimize errors.

## 2023-07-24 NOTE — Progress Notes (Signed)
 [facility] [address] [place] [address] Phone: [phone] Subjective:   Delwyn Filippo, am serving as a scribe for Dr. Ronnell Coins.  I'm seeing this patient by the request  of:  Ollis Bi, MD  CC: Left hip pain  HQI:[id number]  Beckett Maden is a 63 y.o. male coming in with complaint of L hip pain. Patient states that his L hip is painful with walking in the groin. Has not ever had injection in L hip.   Past Medical History:  Diagnosis Date   Cancer (HCC)    basal and squamous - face and arms   High cholesterol    Past Surgical History:  Procedure Laterality Date   ACHILLES TENDON SURGERY Left 07/17/2021   Procedure: LEFT ACHILLES TENDON REPAIR;  Surgeon: Wilhelmenia Harada, MD;  Location: [facility] OR;  Service: Orthopedics;  Laterality: Left;  with regional block   ACHILLES TENDON SURGERY Left 09/13/2021   Procedure: LEFT ACHILLES TENDON REPAIR;  Surgeon: Wilhelmenia Harada, MD;  Location: [facility];  Service: Orthopedics;  Laterality: Left;   COLONOSCOPY     MOHS SURGERY     x 2   TOTAL HIP ARTHROPLASTY Right    Social History   Socioeconomic History   Marital status: Married    Spouse name: Not on file   Number of children: Not on file   Years of education: Not on file   Highest education level: Not on file  Occupational History   Not on file  Tobacco Use   Smoking status: Former    Types: Cigarettes   Smokeless tobacco: Never  Vaping Use   Vaping status: Never Used  Substance and Sexual Activity   Alcohol use: Yes    Comment: occasional beer or scotch   Drug use: Never   Sexual activity: Not on file  Other Topics Concern   Not on file  Social History Narrative   Not on file   Social Drivers of Health   Financial Resource Strain: Not on file  Food Insecurity: Not on file  Transportation Needs: Not on file  Physical Activity: Not on file  Stress: Not on file  Social Connections: Not on file   No Known Allergies No  family history on file.   Current Outpatient Medications (Cardiovascular):    atorvastatin (LIPITOR) 10 MG tablet, Take 10 mg by mouth daily.   Current Outpatient Medications (Analgesics):    aspirin  EC 81 MG tablet, Take 81 mg by mouth daily. Swallow whole.   aspirin  EC 325 MG tablet, Take 1 tablet (325 mg total) by mouth daily.   oxyCODONE  (OXY IR/ROXICODONE ) 5 MG immediate release tablet, Take 1 tablet (5 mg total) by mouth every 4 (four) hours as needed (severe pain). (Patient taking differently: Take 5 mg by mouth every 4 (four) hours as needed for severe pain.)   Current Outpatient Medications (Other):    Multiple Vitamin (MULTIVITAMIN WITH MINERALS) TABS tablet, Take 1 tablet by mouth daily.   VITAMIN D PO, Take 1 capsule by mouth daily.   Reviewed prior external information including notes and imaging from  primary care provider As well as notes that were available from care everywhere and other healthcare systems.  Past medical history, social, surgical and family history all reviewed in electronic medical record.  No pertanent information unless stated regarding to the chief complaint.   Review of Systems:  No headache, visual changes, nausea, vomiting, diarrhea, constipation, dizziness, abdominal pain, skin rash, fevers,  chills, night sweats, weight loss, swollen lymph nodes, body aches, joint swelling, chest pain, shortness of breath, mood changes. POSITIVE muscle aches  Objective  Blood pressure 118/84, pulse 66, height 5' 8 (1.727 m), weight 188 lb 12.8 oz (85.6 kg), SpO2 99%.   General: No apparent distress alert and oriented x3 mood and affect normal, dressed appropriately.  HEENT: Pupils equal, extraocular movements intact  Respiratory: Patient's speak in full sentences and does not appear short of breath  Cardiovascular: No lower extremity edema, non tender, no erythema  Left hip and does have significant decrease in range of motion.  Both in internal and  external.  Antalgic gait noted.    Impression and Recommendations:    The above documentation has been reviewed and is accurate and complete Kennah Hehr M Nehemie Casserly, DO

## 2023-07-25 ENCOUNTER — Other Ambulatory Visit: Payer: Self-pay

## 2023-07-25 ENCOUNTER — Ambulatory Visit (INDEPENDENT_AMBULATORY_CARE_PROVIDER_SITE_OTHER)

## 2023-07-25 ENCOUNTER — Ambulatory Visit: Admitting: Family Medicine

## 2023-07-25 VITALS — BP 118/84 | HR 66 | Ht 68.0 in | Wt 188.8 lb

## 2023-07-25 DIAGNOSIS — M25552 Pain in left hip: Secondary | ICD-10-CM

## 2023-07-25 DIAGNOSIS — M1612 Unilateral primary osteoarthritis, left hip: Secondary | ICD-10-CM | POA: Diagnosis not present

## 2023-07-25 DIAGNOSIS — E7800 Pure hypercholesterolemia, unspecified: Secondary | ICD-10-CM | POA: Insufficient documentation

## 2023-07-25 NOTE — Assessment & Plan Note (Signed)
 Patient did have x-rays today.  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  Patient's anatomical neck appears to be unremarkable.  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  Patient is already had the contralateral side replaced.  Timeline includes a very important in September trip that he needs to be ready for so the sooner he can have surgery the better.  Patient is also having a grandchild soon.  Hold on the steroid injection, discussed medication which patient declined.  All questions were answered and patient feels like he has an orthopedic surgeon he trust and will follow-up with them

## 2023-08-06 ENCOUNTER — Ambulatory Visit: Payer: Self-pay | Admitting: Family Medicine

## 2024-01-26 IMAGING — DX DG ANKLE COMPLETE 3+V*L*
3 series · 3 of 3 positions shown · non-contrast
Comparison: None Available.

CLINICAL DATA: Pain of the left ankle and foot.

EXAM:
LEFT ANKLE COMPLETE - 3+ VIEW

[ankle ap]
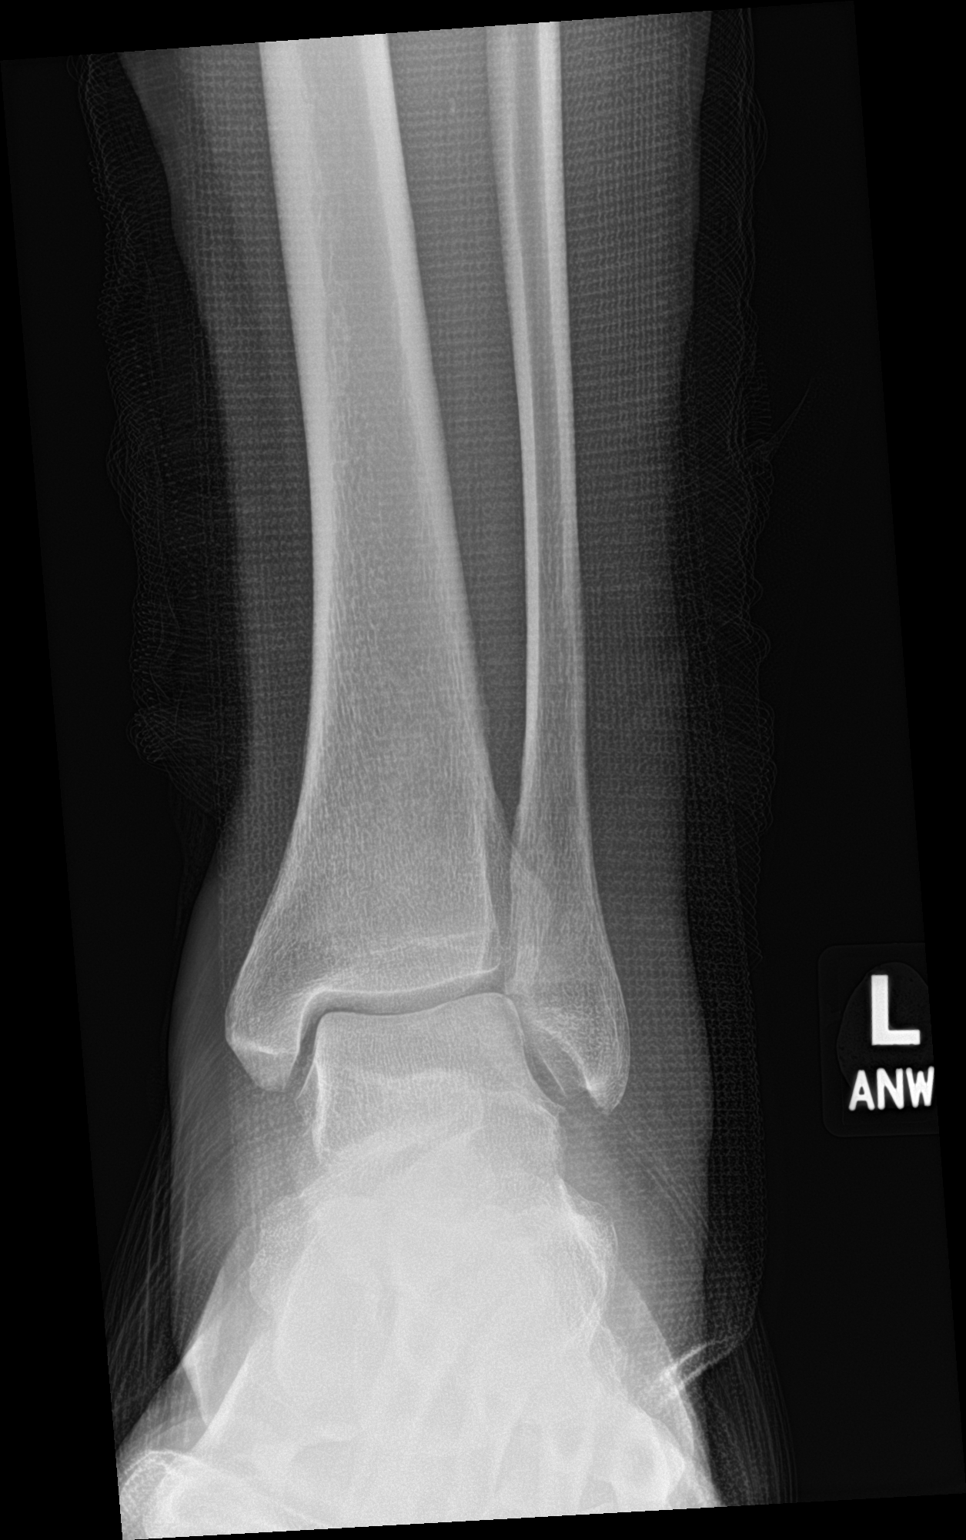

[ankle obl]
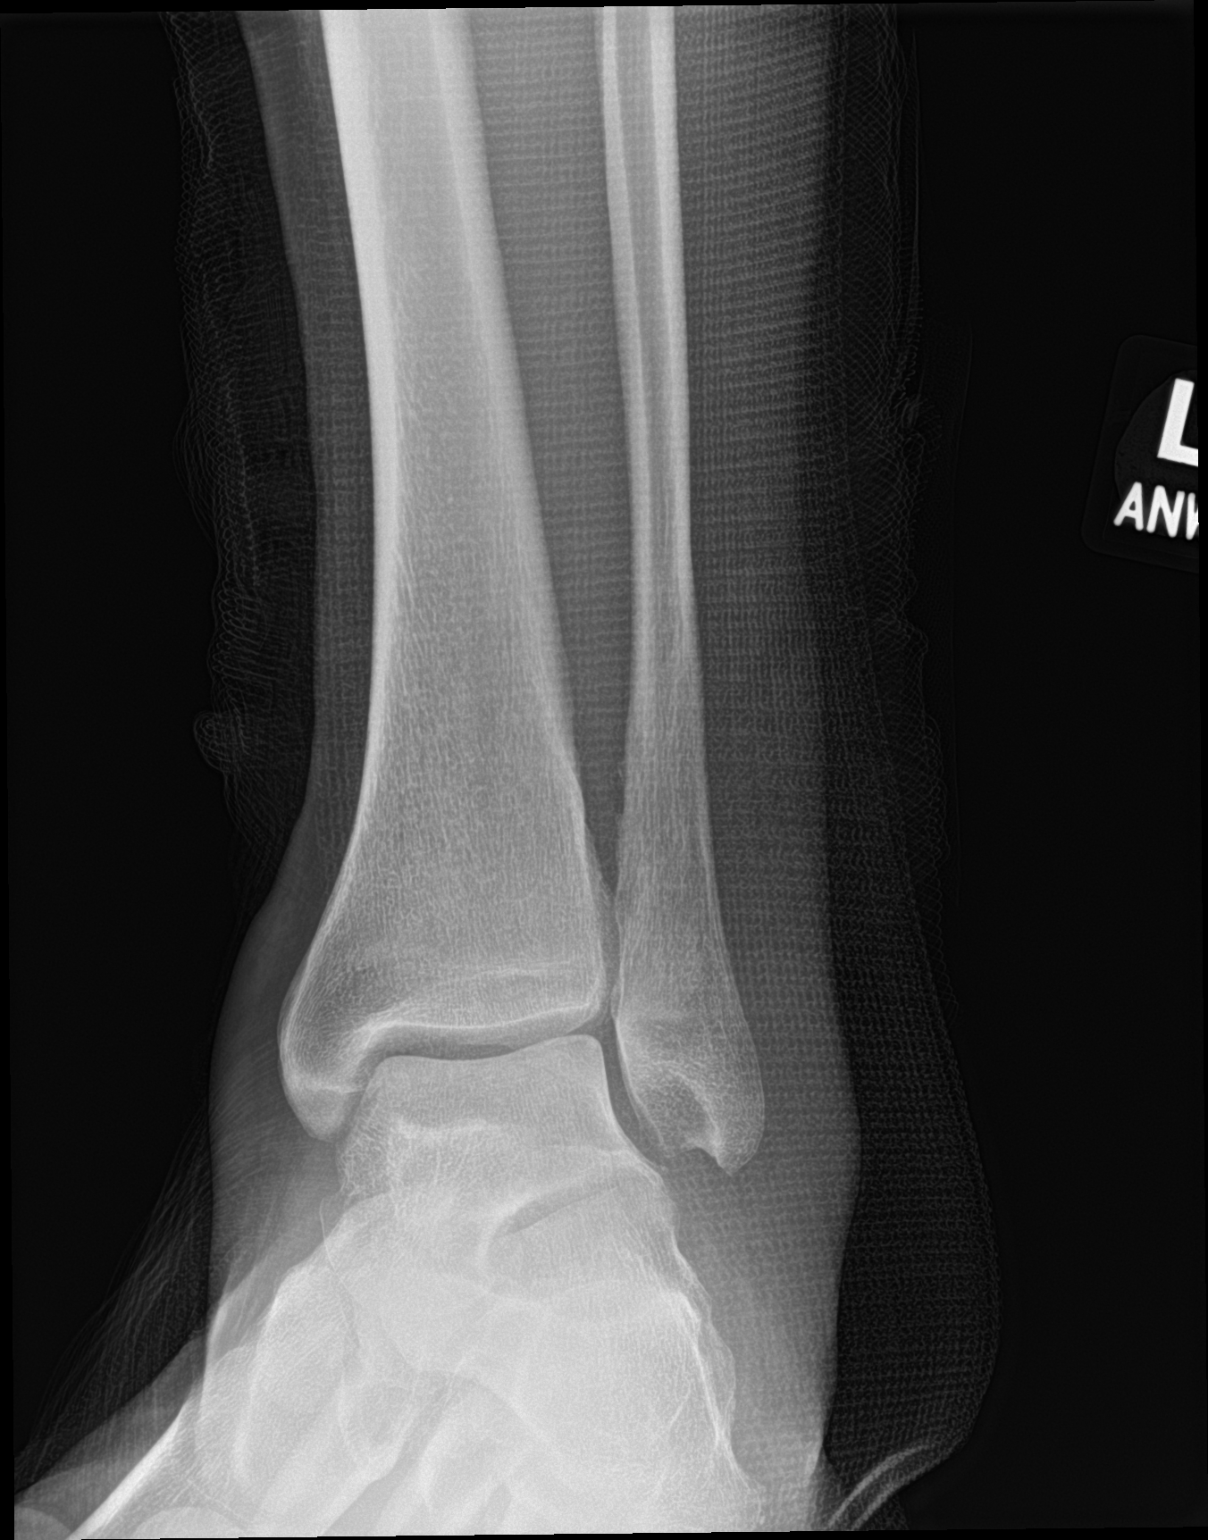

[ankle lat]
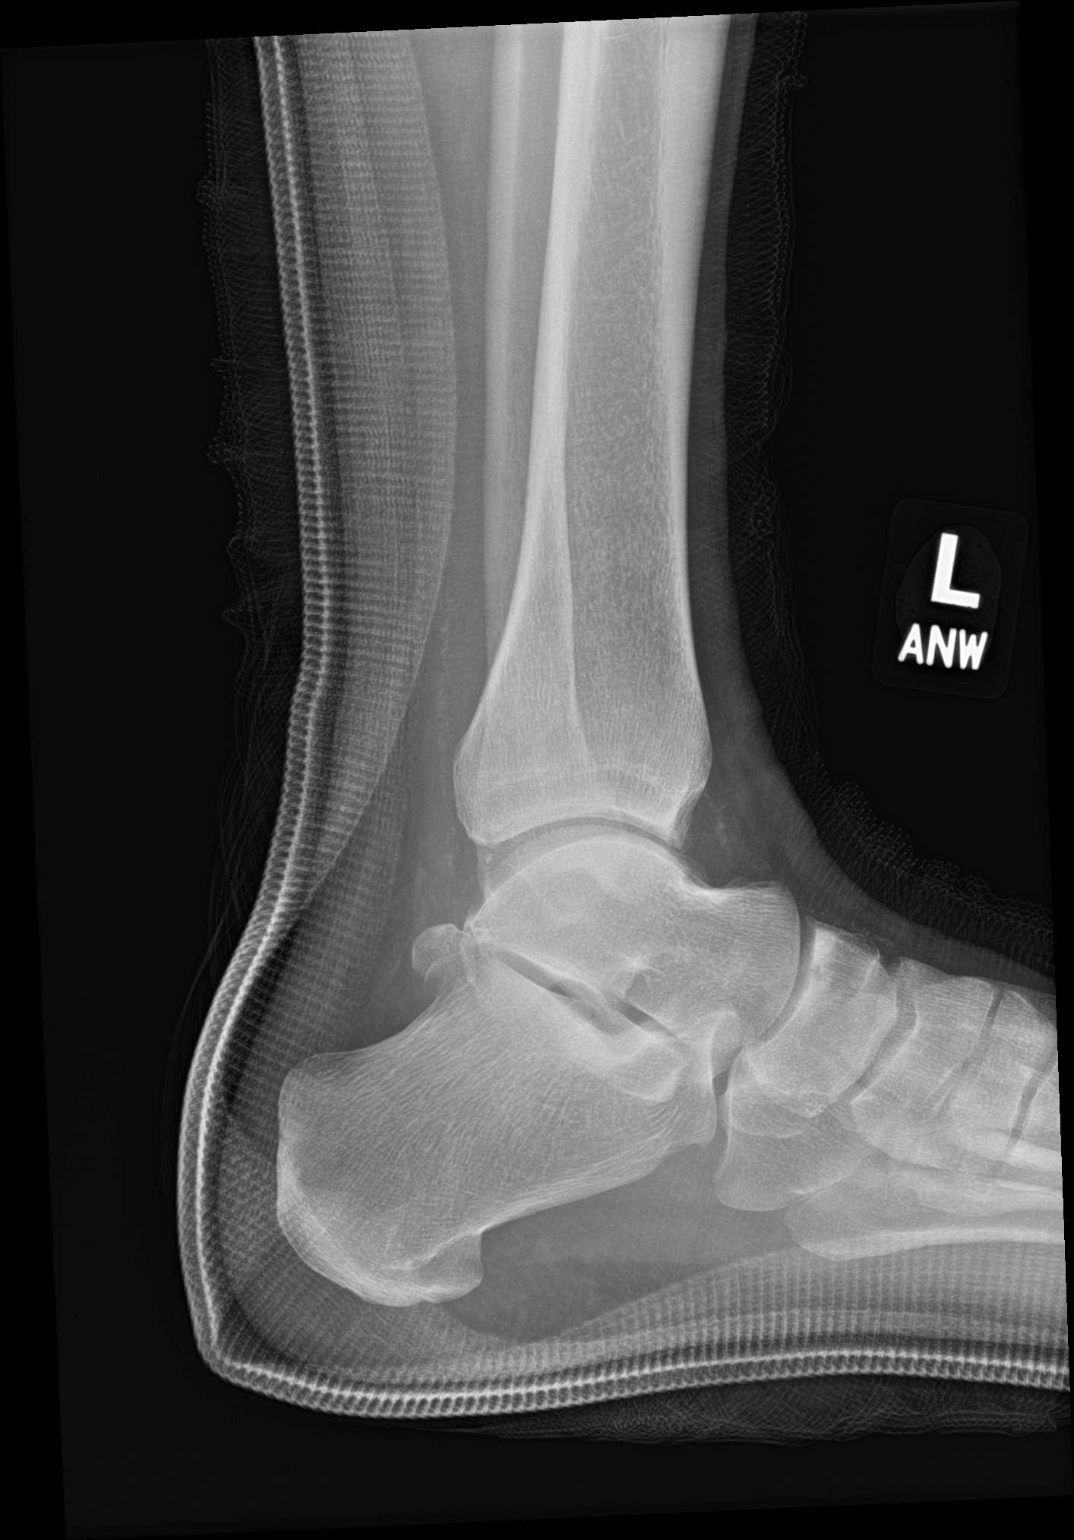

[3 of 3 positions shown; findings below may reference images not displayed]

FINDINGS: The left ankle and foot are in a boot. There is no definite acute
fracture or dislocation noted.
IMPRESSION: No definite acute fracture or dislocation.
